# Patient Record
Sex: Female | Born: 1955 | Race: White | Hispanic: No | Marital: Married | Smoking: Never smoker
Health system: Southern US, Community
[De-identification: ages and names within clinical notes are randomized; demographics above are authoritative.]

## PROBLEM LIST (undated history)

## (undated) DIAGNOSIS — E039 Hypothyroidism, unspecified: Secondary | ICD-10-CM

## (undated) DIAGNOSIS — R112 Nausea with vomiting, unspecified: Secondary | ICD-10-CM

## (undated) DIAGNOSIS — T7840XA Allergy, unspecified, initial encounter: Secondary | ICD-10-CM

## (undated) DIAGNOSIS — K219 Gastro-esophageal reflux disease without esophagitis: Secondary | ICD-10-CM

## (undated) DIAGNOSIS — E785 Hyperlipidemia, unspecified: Secondary | ICD-10-CM

## (undated) DIAGNOSIS — R9431 Abnormal electrocardiogram [ECG] [EKG]: Secondary | ICD-10-CM

## (undated) DIAGNOSIS — Z9889 Other specified postprocedural states: Secondary | ICD-10-CM

## (undated) DIAGNOSIS — R002 Palpitations: Secondary | ICD-10-CM

## (undated) DIAGNOSIS — R011 Cardiac murmur, unspecified: Secondary | ICD-10-CM

## (undated) DIAGNOSIS — K5720 Diverticulitis of large intestine with perforation and abscess without bleeding: Secondary | ICD-10-CM

## (undated) HISTORY — PX: BREAST SURGERY: SHX581

## (undated) HISTORY — PX: COLONOSCOPY: SHX174

## (undated) HISTORY — PX: ROTATOR CUFF REPAIR: SHX139

## (undated) HISTORY — DX: Abnormal electrocardiogram (ECG) (EKG): R94.31

## (undated) HISTORY — PX: POLYPECTOMY: SHX149

## (undated) HISTORY — PX: SIGMOIDOSCOPY: SUR1295

## (undated) HISTORY — PX: TUBAL LIGATION: SHX77

## (undated) HISTORY — DX: Allergy, unspecified, initial encounter: T78.40XA

## (undated) HISTORY — DX: Palpitations: R00.2

## (undated) HISTORY — DX: Diverticulitis of large intestine with perforation and abscess without bleeding: K57.20

## (undated) HISTORY — PX: NOSE SURGERY: SHX723

## (undated) HISTORY — DX: Hyperlipidemia, unspecified: E78.5

---

## 1999-08-18 ENCOUNTER — Other Ambulatory Visit: Admission: RE | Admit: 1999-08-18 | Discharge: 1999-08-18 | Payer: Self-pay | Admitting: *Deleted

## 2000-09-04 ENCOUNTER — Emergency Department (HOSPITAL_COMMUNITY): Admission: EM | Admit: 2000-09-04 | Discharge: 2000-09-05 | Payer: Self-pay | Admitting: Emergency Medicine

## 2000-11-15 ENCOUNTER — Encounter: Admission: RE | Admit: 2000-11-15 | Discharge: 2000-11-15 | Payer: Self-pay | Admitting: *Deleted

## 2000-11-15 ENCOUNTER — Encounter: Payer: Self-pay | Admitting: *Deleted

## 2000-11-17 ENCOUNTER — Encounter: Payer: Self-pay | Admitting: *Deleted

## 2000-11-17 ENCOUNTER — Encounter: Admission: RE | Admit: 2000-11-17 | Discharge: 2000-11-17 | Payer: Self-pay | Admitting: *Deleted

## 2000-11-21 ENCOUNTER — Encounter: Payer: Self-pay | Admitting: Emergency Medicine

## 2000-11-21 ENCOUNTER — Emergency Department (HOSPITAL_COMMUNITY): Admission: EM | Admit: 2000-11-21 | Discharge: 2000-11-21 | Payer: Self-pay | Admitting: Emergency Medicine

## 2000-11-22 ENCOUNTER — Encounter: Payer: Self-pay | Admitting: Emergency Medicine

## 2000-11-22 ENCOUNTER — Ambulatory Visit (HOSPITAL_COMMUNITY): Admission: RE | Admit: 2000-11-22 | Discharge: 2000-11-22 | Payer: Self-pay | Admitting: Emergency Medicine

## 2000-12-30 ENCOUNTER — Encounter: Admission: RE | Admit: 2000-12-30 | Discharge: 2000-12-30 | Payer: Self-pay | Admitting: Sports Medicine

## 2001-04-03 ENCOUNTER — Ambulatory Visit (HOSPITAL_COMMUNITY): Admission: RE | Admit: 2001-04-03 | Discharge: 2001-04-03 | Payer: Self-pay | Admitting: Family Medicine

## 2001-04-03 ENCOUNTER — Encounter: Payer: Self-pay | Admitting: Family Medicine

## 2002-03-08 ENCOUNTER — Encounter: Payer: Self-pay | Admitting: *Deleted

## 2002-03-08 ENCOUNTER — Encounter: Admission: RE | Admit: 2002-03-08 | Discharge: 2002-03-08 | Payer: Self-pay | Admitting: *Deleted

## 2003-08-01 ENCOUNTER — Encounter: Payer: Self-pay | Admitting: Family Medicine

## 2003-08-01 ENCOUNTER — Encounter: Admission: RE | Admit: 2003-08-01 | Discharge: 2003-08-01 | Payer: Self-pay | Admitting: Family Medicine

## 2003-12-27 ENCOUNTER — Encounter: Admission: RE | Admit: 2003-12-27 | Discharge: 2003-12-27 | Payer: Self-pay | Admitting: Family Medicine

## 2005-01-27 ENCOUNTER — Ambulatory Visit (HOSPITAL_COMMUNITY): Admission: RE | Admit: 2005-01-27 | Discharge: 2005-01-27 | Payer: Self-pay | Admitting: Family Medicine

## 2005-02-01 ENCOUNTER — Encounter: Admission: RE | Admit: 2005-02-01 | Discharge: 2005-02-01 | Payer: Self-pay | Admitting: Family Medicine

## 2005-02-15 ENCOUNTER — Encounter: Admission: RE | Admit: 2005-02-15 | Discharge: 2005-02-15 | Payer: Self-pay | Admitting: Family Medicine

## 2005-08-11 ENCOUNTER — Ambulatory Visit (HOSPITAL_COMMUNITY): Admission: RE | Admit: 2005-08-11 | Discharge: 2005-08-13 | Payer: Self-pay | Admitting: Orthopedic Surgery

## 2007-10-16 ENCOUNTER — Encounter: Admission: RE | Admit: 2007-10-16 | Discharge: 2007-10-16 | Payer: Self-pay | Admitting: Family Medicine

## 2008-10-21 ENCOUNTER — Encounter: Admission: RE | Admit: 2008-10-21 | Discharge: 2008-10-21 | Payer: Self-pay | Admitting: Family Medicine

## 2009-03-10 ENCOUNTER — Ambulatory Visit (HOSPITAL_COMMUNITY): Admission: RE | Admit: 2009-03-10 | Discharge: 2009-03-10 | Payer: Self-pay | Admitting: Family Medicine

## 2009-03-20 ENCOUNTER — Ambulatory Visit (HOSPITAL_COMMUNITY): Admission: RE | Admit: 2009-03-20 | Discharge: 2009-03-20 | Payer: Self-pay | Admitting: Family Medicine

## 2010-06-25 IMAGING — CT CT ABDOMEN WO/W CM
2 of 8 series · 12 of 46 positions shown, 18 images · IV contrast (80 ml omni 300)
Comparison: 03/10/2009

CT ABDOMEN

CLINICAL DATA: Right-sided flank pain.  Lower abdominal pain.
Nausea.

CT ABDOMEN AND PELVIS WITHOUT AND WITH CONTRAST
TECHNIQUE: Multidetector CT imaging of the abdomen and pelvis was
performed following the standard protocol before and following the
bolus administration of intravenous contrast.
Contrast: 80 ml Cmnipaque-HDD

[Series 2: routine abdomen · axial · 0.70mm/px · z∈[-402,-88]mm · 9 of 79 slices shown, 15 images]
[im 8/79  soft-tissue]
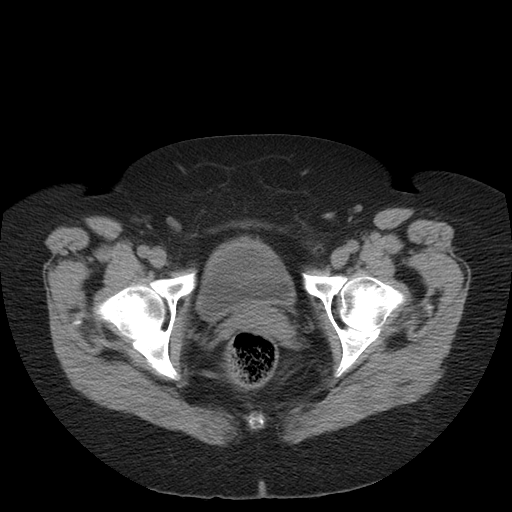
[im 8/79  bone]
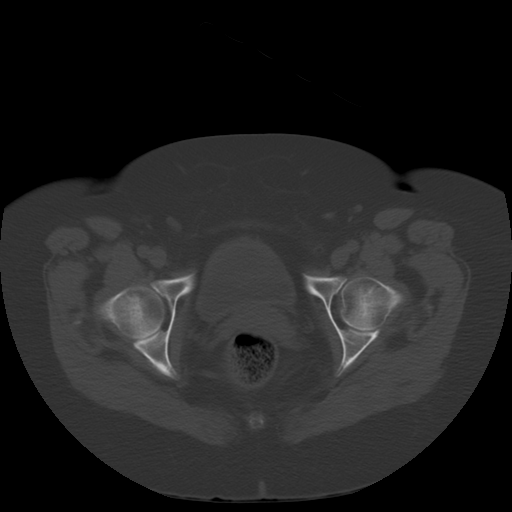
[im 15/79  soft-tissue]
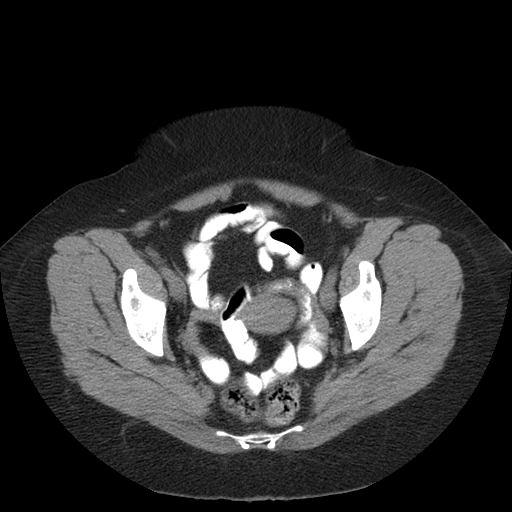
[im 22/79  soft-tissue]
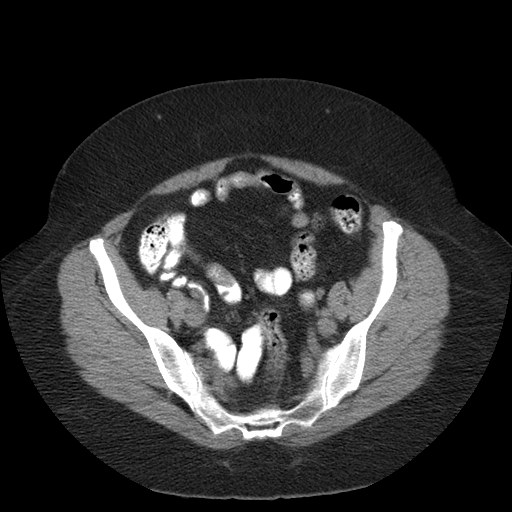
[im 29/79  soft-tissue]
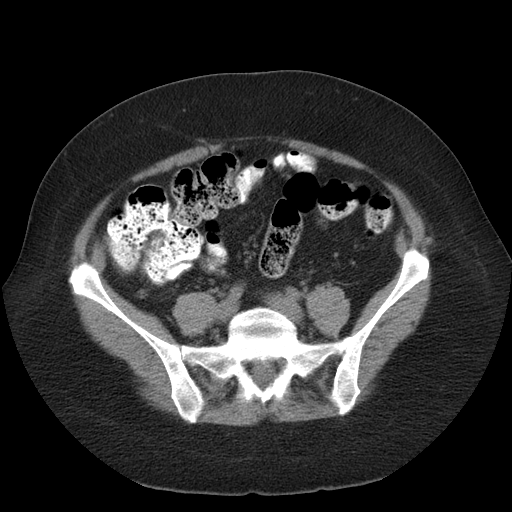
[im 43/79  soft-tissue]
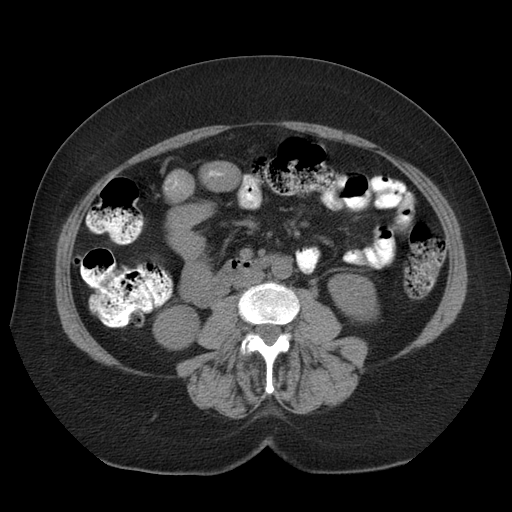
[im 50/79  soft-tissue]
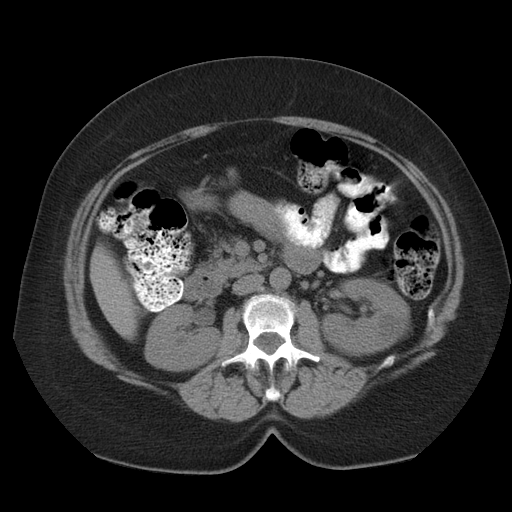
[im 50/79  lung]
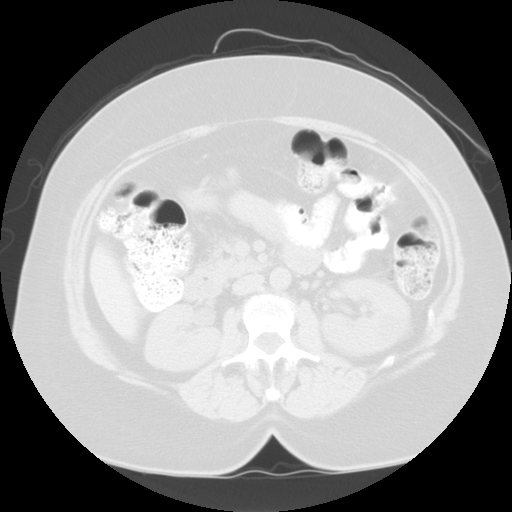
[im 57/79  soft-tissue]
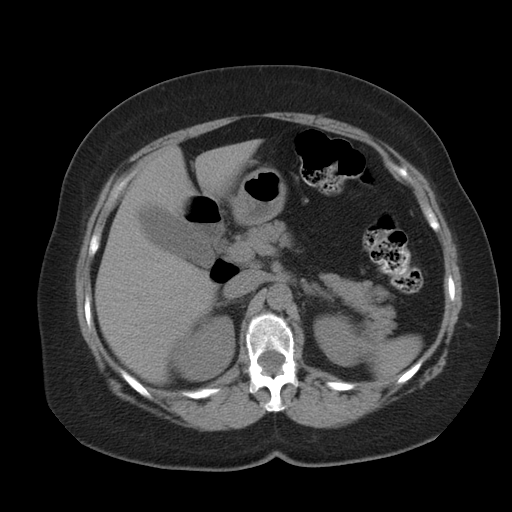
[im 57/79  lung]
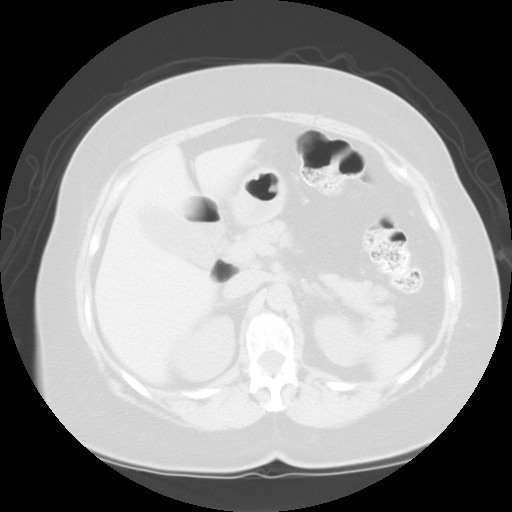
[im 64/79  soft-tissue]
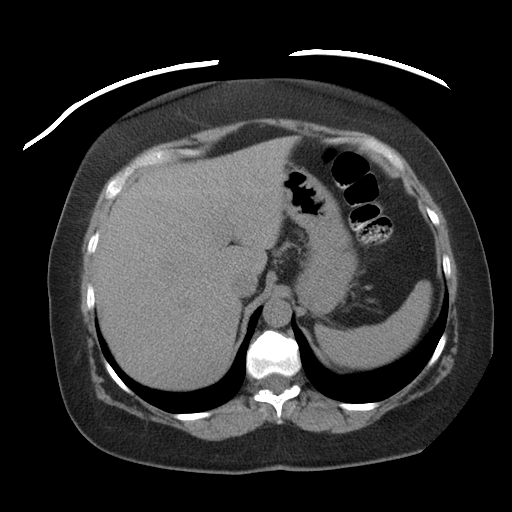
[im 64/79  lung]
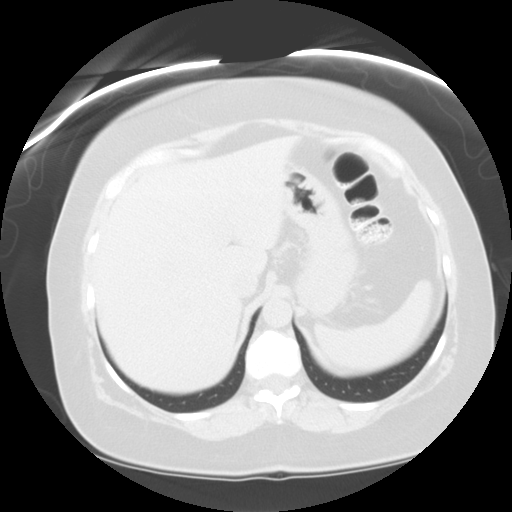
[im 71/79  soft-tissue]
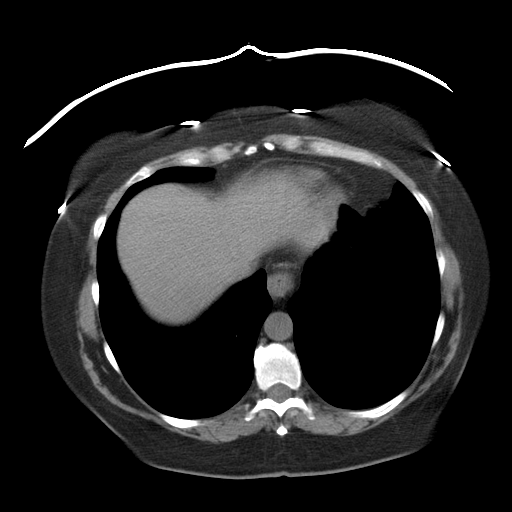
[im 71/79  lung]
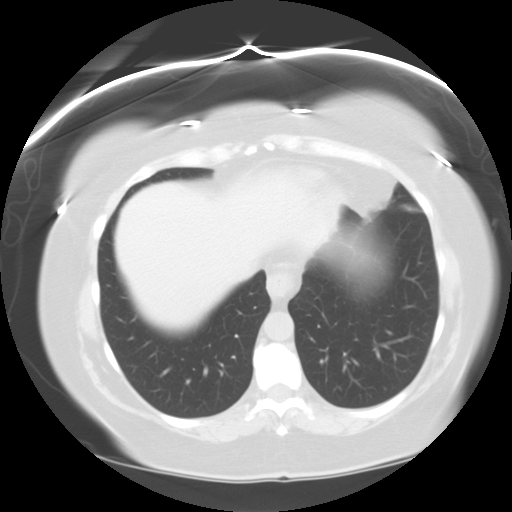
[im 71/79  bone]
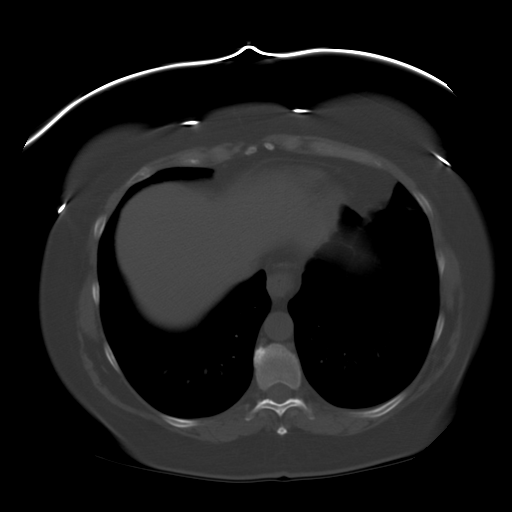

[Series 401: reformatted · coronal · 0.84mm/px · 3 of 102 slices shown]
[im 26/102  soft-tissue]
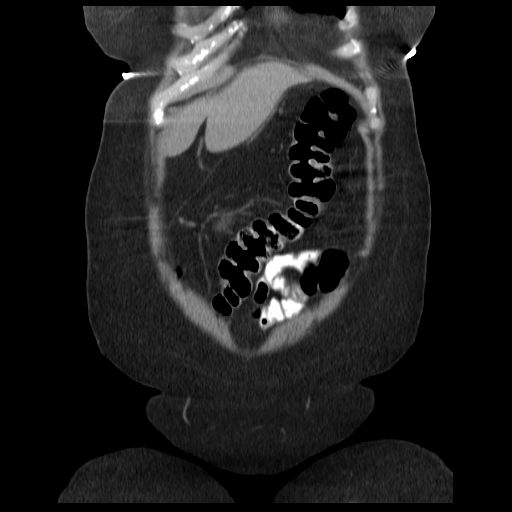
[im 51/102  soft-tissue]
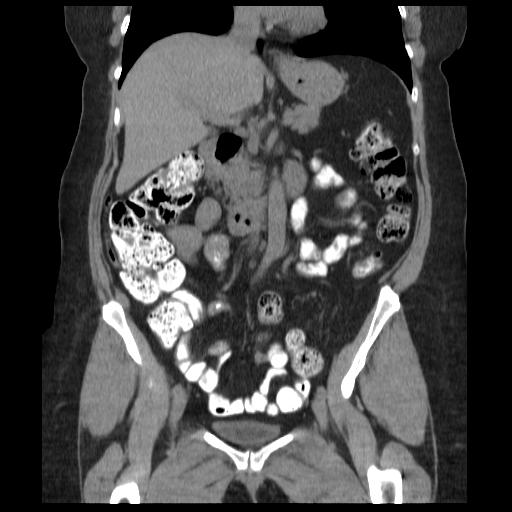
[im 76/102  soft-tissue]
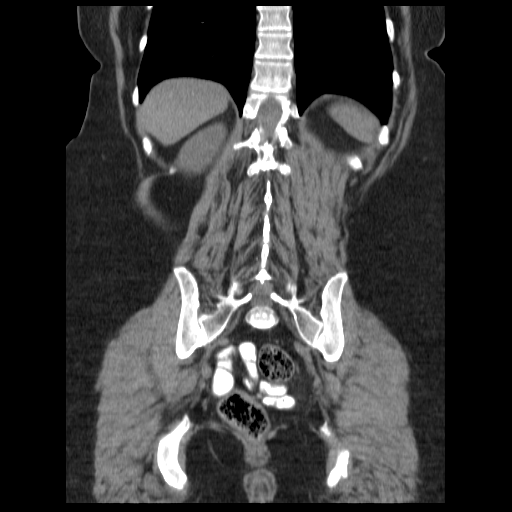

[12 of 46 positions shown; findings below may reference images not displayed]

FINDINGS: No renal calculi or hydronephrosis noted on noncontrast
imaging. Left renal cysts appears simple and nonenhancing.

A lymph node posterior to the pancreatic body measures 1.3 by
cm, borderline enlarged on image 19 of series 5.  The liver,
adrenal glands, and pancreas appear unremarkable.

On image 16 of series 5, a 7 mm faint hypodensity medially in the
spleen is noted.  This finding is nonspecific.  The spleen appears
otherwise normal.

The appendix extends cephalad lateral to the cecum, and appears
normal.

The orally administered contrast makes its way through to the
colon.  Degenerative superior endplate sclerosis is present at the
T12 level.

Although the distal common bile duct appears to taper normally,
more proximally the common bile duct measures up to 9 mm in
diameter, which is mildly dilated.  No intrahepatic bile duct
dilatation.
IMPRESSION: 1.  Mild common bile duct dilatation.  However, the distal common
bile duct is normal in caliber, and no intrahepatic biliary
dilatation is noted.
2.  Borderline enlarged a single peripancreatic lymph node.
3.  Simple-appearing left renal cysts.
4.  Nonspecific 7 mm hypodense lesion medially in the spleen.  This
lesion is statistically likely to be a benign lesion such as
hemangioma, but is technically nonspecific.

CT PELVIS
FINDINGS: Uterine and adnexal contours appear unremarkable.  No
distal ureteral calculus identified.

The urinary bladder appears normal.  Several scattered colonic
diverticula are present but do not appear inflamed.

No free pelvic fluid noted. No pathologic pelvic adenopathy is
identified.
IMPRESSION: 1.  No significant pelvic abnormality is identified explain the
patient's right-sided pain.

## 2010-10-27 ENCOUNTER — Encounter
Admission: RE | Admit: 2010-10-27 | Discharge: 2010-10-27 | Payer: Self-pay | Source: Home / Self Care | Attending: Obstetrics and Gynecology | Admitting: Obstetrics and Gynecology
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
## 2011-09-28 ENCOUNTER — Other Ambulatory Visit: Payer: Self-pay | Admitting: Obstetrics and Gynecology

## 2011-09-28 DIAGNOSIS — Z1231 Encounter for screening mammogram for malignant neoplasm of breast: Secondary | ICD-10-CM

## 2011-11-26 ENCOUNTER — Ambulatory Visit: Payer: Self-pay

## 2011-12-15 ENCOUNTER — Ambulatory Visit: Payer: Self-pay

## 2011-12-24 ENCOUNTER — Ambulatory Visit: Payer: Self-pay

## 2012-02-06 ENCOUNTER — Encounter (HOSPITAL_COMMUNITY): Payer: Self-pay | Admitting: Anesthesiology

## 2012-02-06 ENCOUNTER — Emergency Department (INDEPENDENT_AMBULATORY_CARE_PROVIDER_SITE_OTHER)
Admission: EM | Admit: 2012-02-06 | Discharge: 2012-02-06 | Disposition: A | Discharge status: Home / Self Care | Payer: BC Managed Care – PPO | Source: Home / Self Care | Attending: Emergency Medicine | Admitting: Emergency Medicine

## 2012-02-06 ENCOUNTER — Inpatient Hospital Stay (HOSPITAL_COMMUNITY)
Admission: EM | Admit: 2012-02-06 | Discharge: 2012-02-14 | DRG: 585 | Disposition: A | Discharge status: Home / Self Care | Payer: BC Managed Care – PPO | Attending: General Surgery | Admitting: General Surgery

## 2012-02-06 ENCOUNTER — Encounter (HOSPITAL_COMMUNITY): Payer: Self-pay | Admitting: Emergency Medicine

## 2012-02-06 ENCOUNTER — Emergency Department (HOSPITAL_COMMUNITY): Payer: BC Managed Care – PPO

## 2012-02-06 ENCOUNTER — Encounter (HOSPITAL_COMMUNITY): Payer: Self-pay | Admitting: *Deleted

## 2012-02-06 DIAGNOSIS — K560 Paralytic ileus: Secondary | ICD-10-CM | POA: Diagnosis not present

## 2012-02-06 DIAGNOSIS — K659 Peritonitis, unspecified: Secondary | ICD-10-CM | POA: Diagnosis present

## 2012-02-06 DIAGNOSIS — Z6837 Body mass index (BMI) 37.0-37.9, adult: Secondary | ICD-10-CM

## 2012-02-06 DIAGNOSIS — J111 Influenza due to unidentified influenza virus with other respiratory manifestations: Secondary | ICD-10-CM

## 2012-02-06 DIAGNOSIS — K5720 Diverticulitis of large intestine with perforation and abscess without bleeding: Secondary | ICD-10-CM

## 2012-02-06 DIAGNOSIS — E669 Obesity, unspecified: Secondary | ICD-10-CM | POA: Diagnosis present

## 2012-02-06 DIAGNOSIS — I959 Hypotension, unspecified: Secondary | ICD-10-CM | POA: Diagnosis present

## 2012-02-06 DIAGNOSIS — K668 Other specified disorders of peritoneum: Secondary | ICD-10-CM

## 2012-02-06 DIAGNOSIS — K5732 Diverticulitis of large intestine without perforation or abscess without bleeding: Principal | ICD-10-CM | POA: Diagnosis present

## 2012-02-06 LAB — COMPREHENSIVE METABOLIC PANEL
Albumin: 3.4 g/dL — ABNORMAL LOW (ref 3.5–5.2)
Alkaline Phosphatase: 87 U/L (ref 39–117)
BUN: 11 mg/dL (ref 6–23)
CO2: 20 mEq/L (ref 19–32)
Chloride: 101 mEq/L (ref 96–112)
GFR calc non Af Amer: 79 mL/min — ABNORMAL LOW (ref >90)
Potassium: 3.7 mEq/L (ref 3.5–5.1)
Total Bilirubin: 0.4 mg/dL (ref 0.3–1.2)

## 2012-02-06 LAB — URINALYSIS, ROUTINE W REFLEX MICROSCOPIC
Bilirubin Urine: NEGATIVE
Nitrite: NEGATIVE
Specific Gravity, Urine: 1.029 (ref 1.005–1.030)
pH: 6.5 (ref 5.0–8.0)

## 2012-02-06 LAB — TYPE AND SCREEN

## 2012-02-06 LAB — POCT URINALYSIS DIP (DEVICE)
Bilirubin Urine: NEGATIVE
Ketones, ur: NEGATIVE mg/dL
Leukocytes, UA: NEGATIVE
Specific Gravity, Urine: 1.015 (ref 1.005–1.030)

## 2012-02-06 LAB — PROTIME-INR: Prothrombin Time: 14 seconds (ref 11.6–15.2)

## 2012-02-06 LAB — CBC
HCT: 53.5 % — ABNORMAL HIGH (ref 36.0–46.0)
Hemoglobin: 17.2 g/dL — ABNORMAL HIGH (ref 12.0–15.0)
RBC: 5.79 MIL/uL — ABNORMAL HIGH (ref 3.87–5.11)
WBC: 8.1 10*3/uL (ref 4.0–10.5)

## 2012-02-06 LAB — DIFFERENTIAL
Basophils Absolute: 0 10*3/uL (ref 0.0–0.1)
Basophils Relative: 0 % (ref 0–1)
Eosinophils Relative: 0 % (ref 0–5)
Lymphocytes Relative: 21 % (ref 12–46)
Monocytes Relative: 2 % — ABNORMAL LOW (ref 3–12)
Neutro Abs: 6.2 10*3/uL (ref 1.7–7.7)

## 2012-02-06 LAB — URINE MICROSCOPIC-ADD ON

## 2012-02-06 MED ORDER — CIPROFLOXACIN IN D5W 400 MG/200ML IV SOLN
400.0000 mg | Freq: Once | INTRAVENOUS | Status: AC
  Administered 2012-02-06: 400 mg via INTRAVENOUS
  Filled 2012-02-06: qty 200

## 2012-02-06 MED ORDER — SODIUM CHLORIDE 0.9 % IV SOLN
INTRAVENOUS | Status: DC
  Administered 2012-02-06: 21:00:00 via INTRAVENOUS

## 2012-02-06 MED ORDER — IBUPROFEN 800 MG PO TABS
800.0000 mg | ORAL_TABLET | Freq: Once | ORAL | Status: AC
  Administered 2012-02-06: 800 mg via ORAL

## 2012-02-06 MED ORDER — IOHEXOL 300 MG/ML  SOLN
20.0000 mL | INTRAMUSCULAR | Status: AC
  Administered 2012-02-06 (×2): 20 mL via ORAL

## 2012-02-06 MED ORDER — ONDANSETRON 8 MG PO TBDP: ORAL_TABLET | ORAL | Status: AC

## 2012-02-06 MED ORDER — IOHEXOL 300 MG/ML  SOLN
100.0000 mL | Freq: Once | INTRAMUSCULAR | Status: AC | PRN
  Administered 2012-02-06: 100 mL via INTRAVENOUS

## 2012-02-06 MED ORDER — IBUPROFEN 800 MG PO TABS
ORAL_TABLET | ORAL | Status: AC
  Filled 2012-02-06: qty 7

## 2012-02-06 MED ORDER — PIPERACILLIN-TAZOBACTAM 3.375 G IVPB
3.3750 g | Freq: Once | INTRAVENOUS | Status: DC
  Filled 2012-02-06: qty 50

## 2012-02-06 MED ORDER — IBUPROFEN 600 MG PO TABS: 600.0000 mg | ORAL_TABLET | Freq: Four times a day (QID) | ORAL | Status: AC | PRN

## 2012-02-06 MED ORDER — METOCLOPRAMIDE HCL 5 MG/ML IJ SOLN
10.0000 mg | Freq: Once | INTRAMUSCULAR | Status: AC
  Administered 2012-02-06: 10 mg via INTRAVENOUS
  Filled 2012-02-06: qty 2

## 2012-02-06 MED ORDER — HYDROMORPHONE HCL PF 1 MG/ML IJ SOLN
1.0000 mg | Freq: Once | INTRAMUSCULAR | Status: AC
  Administered 2012-02-06: 1 mg via INTRAVENOUS
  Filled 2012-02-06: qty 1

## 2012-02-06 MED ORDER — ONDANSETRON 4 MG PO TBDP
ORAL_TABLET | ORAL | Status: AC
  Filled 2012-02-06: qty 1

## 2012-02-06 MED ORDER — ONDANSETRON HCL 4 MG/2ML IJ SOLN
4.0000 mg | Freq: Once | INTRAMUSCULAR | Status: AC
  Administered 2012-02-06: 4 mg via INTRAVENOUS
  Filled 2012-02-06: qty 2

## 2012-02-06 MED ORDER — FENTANYL CITRATE 0.05 MG/ML IJ SOLN
50.0000 ug | Freq: Once | INTRAMUSCULAR | Status: AC
  Administered 2012-02-06: 50 ug via INTRAVENOUS
  Filled 2012-02-06: qty 2

## 2012-02-06 MED ORDER — METRONIDAZOLE IN NACL 5-0.79 MG/ML-% IV SOLN
500.0000 mg | Freq: Once | INTRAVENOUS | Status: AC
  Administered 2012-02-06: 500 mg via INTRAVENOUS
  Filled 2012-02-06: qty 100

## 2012-02-06 MED ORDER — SODIUM CHLORIDE 0.9 % IV BOLUS (SEPSIS)
1000.0000 mL | Freq: Once | INTRAVENOUS | Status: AC
  Administered 2012-02-06: 1000 mL via INTRAVENOUS

## 2012-02-06 MED ORDER — ONDANSETRON 4 MG PO TBDP
4.0000 mg | ORAL_TABLET | Freq: Once | ORAL | Status: AC
  Administered 2012-02-06: 4 mg via ORAL

## 2012-02-06 NOTE — ED Notes (Signed)
Patient encouraged to start drinking ct contrast at this time.

## 2012-02-06 NOTE — ED Notes (Signed)
CT notified of patient finishing all her contrast.

## 2012-02-06 NOTE — ED Notes (Signed)
Dr Wilson at bedside.

## 2012-02-06 NOTE — ED Notes (Signed)
Let pt know we needed urine. Gave her a bedpan. Will check back in 20 minutes

## 2012-02-06 NOTE — Discharge Instructions (Signed)
Trying drink as much as electrolyte-containing fluid, such as Pedialyte, Gatorade, or another sports drink, as you can in the next 24 hours. Take small sips at a time. Drinking too much too fast will make you even more nauseous, and cause you to vomit. Return to the ED if you are unable to keep anything down despite the Zofran, if you get worse, or for any other concerns.

## 2012-02-06 NOTE — ED Notes (Signed)
Patient with abdominal pain started this am, increasing this afternoon.  Patient states she has had some nausea, no vomiting.  Patient states she has not been passing gas.  Patient states the pain is sharp and stabbing in nature.  Patient seen at [facility] today for influenza.

## 2012-02-06 NOTE — ED Notes (Signed)
Consent for surgery signed.  Patient waiting for OR readiness.

## 2012-02-06 NOTE — ED Provider Notes (Signed)
History     CSN: [id number]  Arrival date & time 02/06/12  1045   First MD Initiated Contact with Patient 02/06/12 1101      Chief Complaint  Patient presents with  . Emesis    (Consider location/radiation/quality/duration/timing/severity/associated sxs/prior treatment) HPI Comments: HPI : Flu symptoms for about 4 day. Reports feeling feverish, but no documented fevers at home. Also with chills, sweats, myalgias, fatigue, headache. Reports some oderous urine, lower abdominal pain, and some back pain, but no urinary urgency, frequency, hematuria, dysuria. Patient states that she has vomited twice, over the past few days but is tolerating very small amounts of fluids. States she was taking care of multiple family members with flulike symptoms last week. Last bowel movement was this morning, and was normal for her. Symptoms are progressively worsening, despite trying OTC fever reducing medicine and rest and fluids. Did not get her flu shot this year. Patient is currently taking Norco and clindamycin for a infected tooth. Last dose of clindamycin 2 days ago.  Review of Systems: Positive for fatigue, mild nasal congestion, mild sore throat, mild swollen anterior neck glands, mild cough. Negative for acute vision changes, stiff neck, focal weakness, syncope, seizures, respiratory distress,  diarrhea, GU symptoms.  Patient is a 56 y.o. female presenting with flu symptoms. The history is provided by the patient. No language interpreter was used.  Influenza This is a new problem. The current episode started more than 2 days ago. The problem occurs constantly. The problem has not changed since onset.Associated symptoms include headaches. Pertinent negatives include no abdominal pain. The symptoms are aggravated by nothing. The symptoms are relieved by nothing. She has tried nothing for the symptoms. The treatment provided no relief.    History reviewed. No pertinent past medical history.  Past  Surgical History  Procedure Date  . Rotator cuff repair     Family History  Problem Relation Age of Onset  . Diabetes Father   . Hypertension Father     History  Substance Use Topics  . Smoking status: Never Smoker   . Smokeless tobacco: Not on file  . Alcohol Use: No    OB History    Grav Para Term Preterm Abortions TAB SAB Ect Mult Living                  Review of Systems  Gastrointestinal: Negative for abdominal pain.  Neurological: Positive for headaches.    Allergies  Review of patient's allergies indicates no known allergies.  Home Medications   Current Outpatient Rx  Name Route Sig Dispense Refill  . CLINDAMYCIN HCL 150 MG PO CAPS Oral Take 150 mg by mouth 3 (three) times daily.    Marland Kitchen HYDROCODONE-ACETAMINOPHEN 10-325 MG PO TABS Oral Take 1 tablet by mouth every 6 (six) hours as needed.    . IBUPROFEN 600 MG PO TABS Oral Take 1 tablet (600 mg total) by mouth every 6 (six) hours as needed for pain. 30 tablet 0  . ONDANSETRON 8 MG PO TBDP  1/2- 1 tablet q 8 hr prn nausea, vomiting 20 tablet 0    BP 114/77  Pulse 89  Temp(Src) 102.3 F (39.1 C) (Oral)  Resp 20  SpO2 96%  Physical Exam  Nursing note and vitals reviewed. Constitutional: She is oriented to person, place, and time. She appears well-developed and well-nourished.       Appears ill  HENT:  Head: Normocephalic and atraumatic.  Mouth/Throat: Oropharynx is clear and moist. No oropharyngeal exudate.  TMs normal bilaterally. Mild rhinorrhea. No sinus tenderness.  Eyes: Conjunctivae and EOM are normal. Pupils are equal, round, and reactive to light.  Neck: Normal range of motion. Neck supple.  Cardiovascular: Regular rhythm, normal heart sounds and intact distal pulses.        Mild tachycardia  Pulmonary/Chest: Effort normal and breath sounds normal. No respiratory distress. She exhibits no tenderness.  Abdominal: Soft. Bowel sounds are normal. She exhibits no distension and no mass. There is  no rebound and no guarding.       Mild suprapubic tenderness, questionable right CVA tenderness  Musculoskeletal: Normal range of motion.  Lymphadenopathy:    She has no cervical adenopathy.  Neurological: She is alert and oriented to person, place, and time. No cranial nerve deficit. Coordination normal.       Gait steady  Skin: Skin is warm and dry. No rash noted.  Psychiatric: She has a normal mood and affect. Her behavior is normal. Judgment and thought content normal.    ED Course  Procedures (including critical care time)  Labs Reviewed  POCT URINALYSIS DIP (DEVICE) - Abnormal; Notable for the following:    Hgb urine dipstick TRACE (*)    Protein, ur 30 (*)    All other components within normal limits   No results found.   1. Influenza     Results for orders placed during the hospital encounter of 02/06/12  POCT URINALYSIS DIP (DEVICE)      Component Value Range   Glucose, UA NEGATIVE  NEGATIVE (mg/dL)   Bilirubin Urine NEGATIVE  NEGATIVE    Ketones, ur NEGATIVE  NEGATIVE (mg/dL)   Specific Gravity, Urine 1.015  1.005 - 1.030    Hgb urine dipstick TRACE (*) NEGATIVE    pH 6.0  5.0 - 8.0    Protein, ur 30 (*) NEGATIVE (mg/dL)   Urobilinogen, UA 0.2  0.0 - 1.0 (mg/dL)   Nitrite NEGATIVE  NEGATIVE    Leukocytes, UA NEGATIVE  NEGATIVE      MDM   Pt presents 4 days after symptom onset, and is not pregnant / postpartum, is not obese (BMI>30), does not have diabetes, sickle cell disease, HIV infection , is receiving immunosuppressive chemotherapy for cancer,  inflammatory diseases or  marrow or organ transplants, is < 2 or > 38 y/o, has evidence of severe, complicated or progressive illness. Tamiflu not indicated.   Pt Initially febrile, fever trending down prior to discharge. Gave Zofran 4 mg,  patient tolerated by mouth prior to discharge. No evidence of pharyngitis or OM. No evidence of neck stiffness or other sx to support meningitis. No evidence of dehydration. Abd  S/NT/ND without peritoneal sx. Doubt intraabdominal process. No evidence of PNA. Physical suggestive of UTI /pyelonephritis, but Udip negative for infection. Patient has significant exposure to influenza. Pt able to tolerate PO. Pt with flu. Will treat symptomatically and have pt f/u with PCP OF CHOICE PRN     Luiz Blare, MD 02/06/12 1401

## 2012-02-06 NOTE — ED Provider Notes (Signed)
History     CSN: [id number]  Arrival date & time 02/06/12  4098   First MD Initiated Contact with Patient 02/06/12 1846      Chief Complaint  Patient presents with  . Abdominal Pain  . Influenza    (Consider location/radiation/quality/duration/timing/severity/associated sxs/prior treatment) The history is provided by the patient and the spouse.  Pt is a 56 y/o F with no known PMH who presents to the ED with c/c of flu sx x 4 days as well as lower abd pain x 1 day. Flu symptoms include fever up to 103.3, chills, fatigue, HA, nasal congestion, sore throat, cough, nausea with a few episodes of non-bloody emesis, and myalgias. Last night, she had sudden onset of severe bilateral lower abd pain that did not radiate. Resolved spontaneously. She presented to urgent care this morning and was dx with Influenza- there was no abdominal pain at the time of her evaluation at [facility]. Approx 1 hour after she left urgent care, her lower abdominal pain return and she was advised to present to the ED for re-evaluation when she called [facility]. She denies any CP, SOB, vomiting, or diarrhea. Nothing makes her symptoms better or worse.   History reviewed. No pertinent past medical history.  Past Surgical History  Procedure Date  . Rotator cuff repair     Family History  Problem Relation Age of Onset  . Diabetes Father   . Hypertension Father     History  Substance Use Topics  . Smoking status: Never Smoker   . Smokeless tobacco: Not on file  . Alcohol Use: No     Review of Systems 10 systems reviewed and are negative for acute change except as noted in the HPI.  Allergies  Review of patient's allergies indicates no known allergies.  Home Medications   Current Outpatient Rx  Name Route Sig Dispense Refill  . CLINDAMYCIN HCL 150 MG PO CAPS Oral Take 150 mg by mouth 3 (three) times daily.    Marland Kitchen HYDROCODONE-ACETAMINOPHEN 10-325 MG PO TABS Oral Take 1 tablet by mouth every 6 (six) hours as needed.      . IBUPROFEN 600 MG PO TABS Oral Take 1 tablet (600 mg total) by mouth every 6 (six) hours as needed for pain. 30 tablet 0  . ONDANSETRON 8 MG PO TBDP  1/2- 1 tablet q 8 hr prn nausea, vomiting 20 tablet 0    BP 122/67  Pulse 88  Temp(Src) 100 F (37.8 C) (Oral)  Resp 20  SpO2 99%  Physical Exam  Constitutional: She is oriented to person, place, and time. She appears well-developed and well-nourished. Distressed: uncomfortable appearing.       VS reviewed and although sig hypotension initially in triage, pt is normotensive on my assessment at 127/67.  HENT:  Head: Normocephalic and atraumatic.  Right Ear: External ear normal.  Left Ear: External ear normal.       Mucous membranes slightly dry  Eyes: Pupils are equal, round, and reactive to light.  Neck: Normal range of motion. Neck supple.  Cardiovascular: Normal rate, regular rhythm, normal heart sounds and intact distal pulses.   Pulmonary/Chest: Effort normal and breath sounds normal. No respiratory distress. She has no wheezes. She exhibits no tenderness.  Abdominal: Soft. Bowel sounds are normal. She exhibits no distension. There is tenderness. There is no rebound and no guarding.       Bilateral lower quadrant tenderness, left greater than right. No rigidity.  Musculoskeletal: She exhibits no edema and  no tenderness.  Neurological: She is alert and oriented to person, place, and time. No cranial nerve deficit.  Skin: No rash noted.       Pt is pale, clammy  Psychiatric: She has a normal mood and affect.    ED Course  Procedures (including critical care time)  Labs Reviewed  CBC - Abnormal; Notable for the following:    RBC 5.79 (*)    Hemoglobin 17.2 (*)    HCT 53.5 (*)    All other components within normal limits  DIFFERENTIAL - Abnormal; Notable for the following:    Monocytes Relative 2 (*)    All other components within normal limits  COMPREHENSIVE METABOLIC PANEL - Abnormal; Notable for the following:     Glucose, Bld 111 (*)    Albumin 3.4 (*)    ALT 43 (*)    GFR calc non Af Amer 79 (*)    All other components within normal limits  URINALYSIS, ROUTINE W REFLEX MICROSCOPIC - Abnormal; Notable for the following:    Hgb urine dipstick TRACE (*)    Ketones, ur 15 (*)    Leukocytes, UA TRACE (*)    All other components within normal limits  URINE MICROSCOPIC-ADD ON - Abnormal; Notable for the following:    Squamous Epithelial / LPF FEW (*)    All other components within normal limits  PROTIME-INR  TYPE AND SCREEN  ABO/RH   Ct Abdomen Pelvis W Contrast  02/06/2012  *RADIOLOGY REPORT*  Clinical Data: Nausea, vomiting and lower abdominal pain.  CT ABDOMEN AND PELVIS WITH CONTRAST  Technique:  Multidetector CT imaging of the abdomen and pelvis was performed following the standard protocol during bolus administration of intravenous contrast.  Contrast: OMNIPAQUE IOHEXOL 300 MG/ML  SOLN  Comparison: CT of the abdomen and pelvis performed 03/20/2009  Findings: Minimal bibasilar atelectasis is noted.  There is a moderate to large amount of free air noted within the abdomen and pelvis.  This is predominately anterior in location, tracking over the hepatic dome.  This arises from the patient's relatively diffuse diverticulitis; the point of perforation is along the proximal sigmoid colon, with two small collections of air and fluid raising concern for early evolving abscess.  Diffuse soft tissue inflammation is noted along the descending and proximal sigmoid colon; this involves numerous diverticula.  The collections of free air and trace fluid are noted anterior to the left iliopsoas, measuring 2.1 cm and 1.3 cm in size.  The liver and spleen are unremarkable in appearance.  The gallbladder is within normal limits.  The pancreas and adrenal glands are unremarkable.  Scattered bilateral renal cysts are seen, measuring up to 3.9 cm in size.  The kidneys are otherwise grossly unremarkable in appearance.   There is no evidence of hydronephrosis.  No renal or ureteral stones are seen.  No perinephric stranding is appreciated.  No free fluid is identified.  The small bowel is unremarkable in appearance.  The stomach is filled with contrast; a small to moderate hiatal hernia is noted, also filled with contrast.  No acute vascular abnormalities are seen.  The appendix is normal in caliber and contains air, without evidence for appendicitis. Relatively diffuse diverticulosis is noted along much of the colon.  The bladder is mildly distended and grossly unremarkable in appearance.  The uterus is within normal limits.  The ovaries are grossly symmetric; no suspicious adnexal masses are seen.  No inguinal lymphadenopathy is seen.  No acute osseous abnormalities are identified.  IMPRESSION:  1.  Moderate to large amount of free air within the abdomen and pelvis, reflecting perforated diverticulitis. 2.  Relatively diffuse diverticulitis noted along the descending and proximal sigmoid colon.  Collections of free air and trace fluid noted at the proximal sigmoid colon, reflecting the point of perforation, measuring 2.1 cm and 1.3 cm in size, raising concern for early abscess development. 3.  Scattered bilateral renal cysts noted. 4.  Small to moderate hiatal hernia seen. 5.  Relatively diffuse diverticulosis noted involving much of the colon.  Critical Value/emergent results were called by telephone at the time of interpretation on 02/06/2012  at 10:32 p.m.  to  Dr. Glynn Octave, who verbally acknowledged these results.  Original Report Authenticated By: Tonia Ghent, M.D.     1. Pneumoperitoneum   2. Perforated diverticulum of large intestine       MDM  Influenza sx x 4 days, abdominal pain x 1 day. Labs demonstrate only likely dehydration, for which pt is given IVF in ED. CT abd/pelvis demonstrates free with perforated diverticulitis. General surgery has been consulted by attending MD and will see pt in ED for  surgery admit.        Shaaron Adler, PA-C 02/07/12 0021

## 2012-02-06 NOTE — Anesthesia Preprocedure Evaluation (Signed)
Anesthesia Evaluation  Patient identified by MRN, date of birth, ID band Patient awake    Reviewed: Allergy & Precautions, H&P , NPO status , Patient's Chart, lab work & pertinent test results, reviewed documented beta blocker date and time   Airway Mallampati: II TM Distance: >3 FB Neck ROM: full    Dental   Pulmonary neg pulmonary ROS,          Cardiovascular negative cardio ROS      Neuro/Psych negative neurological ROS  negative psych ROS   GI/Hepatic negative GI ROS, Neg liver ROS,   Endo/Other  negative endocrine ROS  Renal/GU negative Renal ROS  negative genitourinary   Musculoskeletal   Abdominal   Peds  Hematology negative hematology ROS (+)   Anesthesia Other Findings See surgeon's H&P   Reproductive/Obstetrics negative OB ROS                           Anesthesia Physical Anesthesia Plan  ASA: II and Emergent  Anesthesia Plan: General   Post-op Pain Management:    Induction:   Airway Management Planned:   Additional Equipment:   Intra-op Plan:   Post-operative Plan:   Informed Consent: I have reviewed the patients History and Physical, chart, labs and discussed the procedure including the risks, benefits and alternatives for the proposed anesthesia with the patient or authorized representative who has indicated his/her understanding and acceptance.     Plan Discussed with: CRNA and Surgeon  Anesthesia Plan Comments:         Anesthesia Quick Evaluation

## 2012-02-06 NOTE — ED Notes (Signed)
Co fever,chills, abd pain with vomiting, x 3 days, also co bodyaches

## 2012-02-06 NOTE — ED Notes (Signed)
Pt states she was seen at [facility] earlier today for influenza.  States she is now having sharp abd pain since she returned home.  Also c/o nausea, vomiting, chills, and body aches. Pt hypotensive at triage.

## 2012-02-07 ENCOUNTER — Encounter (HOSPITAL_COMMUNITY): Admission: EM | Disposition: A | Discharge status: Home / Self Care | Payer: Self-pay | Source: Home / Self Care

## 2012-02-07 ENCOUNTER — Inpatient Hospital Stay (HOSPITAL_COMMUNITY): Payer: BC Managed Care – PPO | Admitting: Anesthesiology

## 2012-02-07 ENCOUNTER — Encounter (HOSPITAL_COMMUNITY): Payer: Self-pay | Admitting: Anesthesiology

## 2012-02-07 ENCOUNTER — Encounter (HOSPITAL_COMMUNITY): Payer: Self-pay | Admitting: *Deleted

## 2012-02-07 ENCOUNTER — Encounter (HOSPITAL_COMMUNITY): Payer: Self-pay | Admitting: General Surgery

## 2012-02-07 DIAGNOSIS — K630 Abscess of intestine: Secondary | ICD-10-CM

## 2012-02-07 DIAGNOSIS — K5720 Diverticulitis of large intestine with perforation and abscess without bleeding: Secondary | ICD-10-CM

## 2012-02-07 DIAGNOSIS — S31109A Unspecified open wound of abdominal wall, unspecified quadrant without penetration into peritoneal cavity, initial encounter: Secondary | ICD-10-CM

## 2012-02-07 DIAGNOSIS — K631 Perforation of intestine (nontraumatic): Secondary | ICD-10-CM

## 2012-02-07 DIAGNOSIS — K5732 Diverticulitis of large intestine without perforation or abscess without bleeding: Secondary | ICD-10-CM

## 2012-02-07 HISTORY — PX: COLOSTOMY: SHX63

## 2012-02-07 HISTORY — PX: PARTIAL COLECTOMY: SHX5273

## 2012-02-07 HISTORY — DX: Diverticulitis of large intestine with perforation and abscess without bleeding: K57.20

## 2012-02-07 HISTORY — PX: LAPAROTOMY: SHX154

## 2012-02-07 LAB — CBC
MCH: 29.7 pg (ref 26.0–34.0)
MCHC: 32.9 g/dL (ref 30.0–36.0)
MCV: 90.3 fL (ref 78.0–100.0)
Platelets: 200 10*3/uL (ref 150–400)
RBC: 4.21 MIL/uL (ref 3.87–5.11)
RDW: 13.5 % (ref 11.5–15.5)

## 2012-02-07 LAB — GLUCOSE, CAPILLARY: Glucose-Capillary: 212 mg/dL — ABNORMAL HIGH (ref 70–99)

## 2012-02-07 LAB — MAGNESIUM: Magnesium: 1.5 mg/dL (ref 1.5–2.5)

## 2012-02-07 LAB — BASIC METABOLIC PANEL
Chloride: 107 mEq/L (ref 96–112)
Creatinine, Ser: 0.64 mg/dL (ref 0.50–1.10)
GFR calc Af Amer: >90 mL/min (ref >90)
GFR calc non Af Amer: >90 mL/min (ref >90)

## 2012-02-07 SURGERY — LAPAROTOMY, EXPLORATORY
Anesthesia: General | Site: Abdomen | Wound class: Dirty or Infected

## 2012-02-07 MED ORDER — MORPHINE SULFATE (PF) 1 MG/ML IV SOLN
INTRAVENOUS | Status: DC
  Administered 2012-02-07: 20 mg via INTRAVENOUS
  Administered 2012-02-07: 13.5 mg via INTRAVENOUS
  Administered 2012-02-07: 22:00:00 via INTRAVENOUS
  Administered 2012-02-07: 12.59 mg via INTRAVENOUS
  Administered 2012-02-07: 10:00:00 via INTRAVENOUS
  Administered 2012-02-07: 7 mg via INTRAVENOUS
  Administered 2012-02-07: 05:00:00 via INTRAVENOUS
  Administered 2012-02-07: 20.41 mg via INTRAVENOUS
  Administered 2012-02-08: 7 mg via INTRAVENOUS
  Administered 2012-02-08: 03:00:00 via INTRAVENOUS
  Administered 2012-02-08: 8 mg via INTRAVENOUS
  Administered 2012-02-08: 19.72 mg via INTRAVENOUS
  Administered 2012-02-08: 6 mg via INTRAVENOUS
  Administered 2012-02-08: 10 mg via INTRAVENOUS
  Administered 2012-02-09: 7 mg via INTRAVENOUS
  Administered 2012-02-09: 4 mg via INTRAVENOUS
  Administered 2012-02-09: 2 mg via INTRAVENOUS
  Administered 2012-02-09: 7 mg via INTRAVENOUS
  Administered 2012-02-09: 3 mg via INTRAVENOUS
  Administered 2012-02-09: 3.8 mg via INTRAVENOUS
  Administered 2012-02-09: 8 mg via INTRAVENOUS
  Administered 2012-02-09: 01:00:00 via INTRAVENOUS
  Administered 2012-02-10: 2 mg via INTRAVENOUS
  Administered 2012-02-10: 3 mg via INTRAVENOUS
  Administered 2012-02-10: 2 mg via INTRAVENOUS
  Administered 2012-02-11: 1 mg via INTRAVENOUS
  Administered 2012-02-11 (×3): 3 mg via INTRAVENOUS
  Administered 2012-02-11: 1.8 mg via INTRAVENOUS
  Administered 2012-02-12: 2 mg via INTRAVENOUS
  Administered 2012-02-12: 1 mg via INTRAVENOUS
  Administered 2012-02-12 – 2012-02-13 (×2): 2 mg via INTRAVENOUS

## 2012-02-07 MED ORDER — FENTANYL CITRATE 0.05 MG/ML IJ SOLN
INTRAMUSCULAR | Status: DC | PRN
  Administered 2012-02-07: 100 ug via INTRAVENOUS
  Administered 2012-02-07: 150 ug via INTRAVENOUS
  Administered 2012-02-07: 50 ug via INTRAVENOUS
  Administered 2012-02-07: 100 ug via INTRAVENOUS
  Administered 2012-02-07 (×3): 50 ug via INTRAVENOUS

## 2012-02-07 MED ORDER — MORPHINE SULFATE (PF) 1 MG/ML IV SOLN
INTRAVENOUS | Status: AC
  Filled 2012-02-07: qty 25

## 2012-02-07 MED ORDER — CIPROFLOXACIN IN D5W 400 MG/200ML IV SOLN
400.0000 mg | Freq: Two times a day (BID) | INTRAVENOUS | Status: DC
  Administered 2012-02-07 – 2012-02-13 (×14): 400 mg via INTRAVENOUS
  Filled 2012-02-07 (×18): qty 200

## 2012-02-07 MED ORDER — DIPHENHYDRAMINE HCL 50 MG/ML IJ SOLN: 12.5000 mg | Freq: Four times a day (QID) | INTRAMUSCULAR | Status: DC | PRN

## 2012-02-07 MED ORDER — DROPERIDOL 2.5 MG/ML IJ SOLN
INTRAMUSCULAR | Status: DC | PRN
  Administered 2012-02-07: 0.625 mg via INTRAVENOUS

## 2012-02-07 MED ORDER — NALOXONE HCL 0.4 MG/ML IJ SOLN: 0.4000 mg | INTRAMUSCULAR | Status: DC | PRN

## 2012-02-07 MED ORDER — VECURONIUM BROMIDE 10 MG IV SOLR
INTRAVENOUS | Status: DC | PRN
  Administered 2012-02-07: 2 mg via INTRAVENOUS
  Administered 2012-02-07: 6 mg via INTRAVENOUS
  Administered 2012-02-07 (×2): 2 mg via INTRAVENOUS

## 2012-02-07 MED ORDER — LIDOCAINE HCL (CARDIAC) 20 MG/ML IV SOLN
INTRAVENOUS | Status: DC | PRN
  Administered 2012-02-07: 100 mg via INTRAVENOUS

## 2012-02-07 MED ORDER — SODIUM CHLORIDE 0.9 % IJ SOLN: 9.0000 mL | INTRAMUSCULAR | Status: DC | PRN

## 2012-02-07 MED ORDER — METRONIDAZOLE IN NACL 5-0.79 MG/ML-% IV SOLN
500.0000 mg | Freq: Three times a day (TID) | INTRAVENOUS | Status: DC
  Administered 2012-02-07 – 2012-02-14 (×22): 500 mg via INTRAVENOUS
  Filled 2012-02-07 (×25): qty 100

## 2012-02-07 MED ORDER — ONDANSETRON HCL 4 MG PO TABS: 4.0000 mg | ORAL_TABLET | Freq: Four times a day (QID) | ORAL | Status: DC | PRN

## 2012-02-07 MED ORDER — SODIUM CHLORIDE 0.9 % IV SOLN
INTRAVENOUS | Status: DC | PRN
  Administered 2012-02-07 (×2): via INTRAVENOUS

## 2012-02-07 MED ORDER — PANTOPRAZOLE SODIUM 40 MG IV SOLR
40.0000 mg | Freq: Every day | INTRAVENOUS | Status: DC
  Administered 2012-02-07 – 2012-02-12 (×6): 40 mg via INTRAVENOUS
  Filled 2012-02-07 (×7): qty 40

## 2012-02-07 MED ORDER — HETASTARCH-ELECTROLYTES 6 % IV SOLN
500.0000 mL | Freq: Once | INTRAVENOUS | Status: AC
  Administered 2012-02-07: 500 mL via INTRAVENOUS
  Filled 2012-02-07: qty 500

## 2012-02-07 MED ORDER — HYDROMORPHONE HCL PF 1 MG/ML IJ SOLN
INTRAMUSCULAR | Status: AC
  Filled 2012-02-07: qty 1

## 2012-02-07 MED ORDER — INSULIN ASPART 100 UNIT/ML ~~LOC~~ SOLN
0.0000 [IU] | SUBCUTANEOUS | Status: DC
  Administered 2012-02-07: 3 [IU] via SUBCUTANEOUS
  Administered 2012-02-08 – 2012-02-11 (×11): 2 [IU] via SUBCUTANEOUS
  Administered 2012-02-12: 3 [IU] via SUBCUTANEOUS
  Administered 2012-02-12 – 2012-02-13 (×3): 2 [IU] via SUBCUTANEOUS
  Administered 2012-02-13: 3 [IU] via SUBCUTANEOUS
  Administered 2012-02-13: 2 [IU] via SUBCUTANEOUS
  Filled 2012-02-07: qty 0.15

## 2012-02-07 MED ORDER — MORPHINE SULFATE (PF) 1 MG/ML IV SOLN
INTRAVENOUS | Status: AC
  Administered 2012-02-07: 15:00:00 via INTRAVENOUS
  Filled 2012-02-07: qty 25

## 2012-02-07 MED ORDER — ONDANSETRON HCL 4 MG/2ML IJ SOLN
4.0000 mg | Freq: Four times a day (QID) | INTRAMUSCULAR | Status: DC | PRN
  Administered 2012-02-08 – 2012-02-13 (×11): 4 mg via INTRAVENOUS
  Filled 2012-02-07 (×11): qty 2

## 2012-02-07 MED ORDER — SUCCINYLCHOLINE CHLORIDE 20 MG/ML IJ SOLN
INTRAMUSCULAR | Status: DC | PRN
  Administered 2012-02-07: 140 mg via INTRAVENOUS

## 2012-02-07 MED ORDER — DIPHENHYDRAMINE HCL 12.5 MG/5ML PO ELIX
12.5000 mg | ORAL_SOLUTION | Freq: Four times a day (QID) | ORAL | Status: DC | PRN
  Filled 2012-02-07: qty 5

## 2012-02-07 MED ORDER — PHENOL 1.4 % MT LIQD: 1.0000 | OROMUCOSAL | Status: DC | PRN

## 2012-02-07 MED ORDER — ENOXAPARIN SODIUM 40 MG/0.4ML ~~LOC~~ SOLN
40.0000 mg | SUBCUTANEOUS | Status: DC
  Administered 2012-02-07 – 2012-02-13 (×7): 40 mg via SUBCUTANEOUS
  Filled 2012-02-07 (×8): qty 0.4

## 2012-02-07 MED ORDER — GLYCOPYRROLATE 0.2 MG/ML IJ SOLN
INTRAMUSCULAR | Status: DC | PRN
  Administered 2012-02-07: .6 mg via INTRAVENOUS

## 2012-02-07 MED ORDER — NEOSTIGMINE METHYLSULFATE 1 MG/ML IJ SOLN
INTRAMUSCULAR | Status: DC | PRN
  Administered 2012-02-07: 5 mg via INTRAVENOUS

## 2012-02-07 MED ORDER — PROPOFOL 10 MG/ML IV EMUL
INTRAVENOUS | Status: DC | PRN
  Administered 2012-02-07: 200 mg via INTRAVENOUS

## 2012-02-07 MED ORDER — ONDANSETRON HCL 4 MG/2ML IJ SOLN
INTRAMUSCULAR | Status: DC | PRN
  Administered 2012-02-07: 4 mg via INTRAVENOUS

## 2012-02-07 MED ORDER — HYDROMORPHONE HCL PF 1 MG/ML IJ SOLN
0.2500 mg | INTRAMUSCULAR | Status: DC | PRN
  Administered 2012-02-07 (×4): 0.5 mg via INTRAVENOUS

## 2012-02-07 MED ORDER — KCL IN DEXTROSE-NACL 20-5-0.45 MEQ/L-%-% IV SOLN
INTRAVENOUS | Status: DC
  Administered 2012-02-07: 14:00:00 via INTRAVENOUS
  Administered 2012-02-07: 125 mL/h via INTRAVENOUS
  Administered 2012-02-08 – 2012-02-13 (×8): via INTRAVENOUS
  Filled 2012-02-07 (×21): qty 1000

## 2012-02-07 MED ORDER — MIDAZOLAM HCL 5 MG/5ML IJ SOLN
INTRAMUSCULAR | Status: DC | PRN
  Administered 2012-02-07: 2 mg via INTRAVENOUS

## 2012-02-07 MED ORDER — METOCLOPRAMIDE HCL 5 MG/ML IJ SOLN
INTRAMUSCULAR | Status: DC | PRN
  Administered 2012-02-07: 10 mg via INTRAVENOUS

## 2012-02-07 MED ORDER — LACTATED RINGERS IV SOLN
INTRAVENOUS | Status: DC | PRN
  Administered 2012-02-07: 03:00:00 via INTRAVENOUS

## 2012-02-07 MED ORDER — METOCLOPRAMIDE HCL 5 MG/ML IJ SOLN: 10.0000 mg | Freq: Once | INTRAMUSCULAR | Status: DC | PRN

## 2012-02-07 SURGICAL SUPPLY — 46 items
BLADE SURG ROTATE 9660 (MISCELLANEOUS) ×4 IMPLANT
CANISTER SUCTION 2500CC (MISCELLANEOUS) IMPLANT
CANISTER WOUND CARE 500ML ATS (WOUND CARE) ×4 IMPLANT
CHLORAPREP W/TINT 26ML (MISCELLANEOUS) ×4 IMPLANT
CLOTH BEACON ORANGE TIMEOUT ST (SAFETY) ×4 IMPLANT
COVER SURGICAL LIGHT HANDLE (MISCELLANEOUS) ×4 IMPLANT
DRAPE LAPAROSCOPIC ABDOMINAL (DRAPES) ×4 IMPLANT
DRAPE UTILITY 15X26 W/TAPE STR (DRAPE) ×8 IMPLANT
DRAPE WARM FLUID 44X44 (DRAPE) ×4 IMPLANT
DRSG VAC ATS LRG SENSATRAC (GAUZE/BANDAGES/DRESSINGS) ×8 IMPLANT
ELECT BLADE 6.5 EXT (BLADE) ×4 IMPLANT
ELECT CAUTERY BLADE 6.4 (BLADE) ×4 IMPLANT
ELECT REM PT RETURN 9FT ADLT (ELECTROSURGICAL) ×4
ELECTRODE REM PT RTRN 9FT ADLT (ELECTROSURGICAL) ×3 IMPLANT
GLOVE BIOGEL M STRL SZ7.5 (GLOVE) ×8 IMPLANT
GLOVE BIOGEL PI IND STRL 8 (GLOVE) ×6 IMPLANT
GLOVE BIOGEL PI INDICATOR 8 (GLOVE) ×2
GOWN STRL NON-REIN LRG LVL3 (GOWN DISPOSABLE) ×8 IMPLANT
GOWN STRL REIN XL XLG (GOWN DISPOSABLE) ×8 IMPLANT
KIT BASIN OR (CUSTOM PROCEDURE TRAY) ×4 IMPLANT
KIT OSTOMY DRAINABLE 2.75 STR (WOUND CARE) ×4 IMPLANT
KIT ROOM TURNOVER OR (KITS) ×4 IMPLANT
LIGASURE IMPACT 36 18CM CVD LR (INSTRUMENTS) ×4 IMPLANT
NS IRRIG 1000ML POUR BTL (IV SOLUTION) ×32 IMPLANT
PACK GENERAL/GYN (CUSTOM PROCEDURE TRAY) ×4 IMPLANT
PAD ARMBOARD 7.5X6 YLW CONV (MISCELLANEOUS) ×8 IMPLANT
RELOAD PROXIMATE 75MM BLUE (ENDOMECHANICALS) ×4 IMPLANT
SEPRAFILM PROCEDURAL PACK 3X5 (MISCELLANEOUS) ×4 IMPLANT
SPECIMEN JAR LARGE (MISCELLANEOUS) ×4 IMPLANT
SPONGE GAUZE 4X4 12PLY (GAUZE/BANDAGES/DRESSINGS) IMPLANT
SPONGE LAP 18X18 X RAY DECT (DISPOSABLE) ×12 IMPLANT
STAPLER PROXIMATE 75MM BLUE (STAPLE) ×4 IMPLANT
STAPLER VISISTAT 35W (STAPLE) ×4 IMPLANT
SUCTION POOLE TIP (SUCTIONS) ×4 IMPLANT
SUT PDS AB 1 TP1 96 (SUTURE) ×8 IMPLANT
SUT PROLENE 2 0 CT2 30 (SUTURE) ×4 IMPLANT
SUT SILK 2 0 SH CR/8 (SUTURE) ×8 IMPLANT
SUT SILK 2 0 TIES 10X30 (SUTURE) ×4 IMPLANT
SUT SILK 3 0 SH CR/8 (SUTURE) ×4 IMPLANT
SUT SILK 3 0 TIES 10X30 (SUTURE) ×4 IMPLANT
SUT VIC AB 3-0 SH 18 (SUTURE) ×12 IMPLANT
TOWEL OR 17X24 6PK STRL BLUE (TOWEL DISPOSABLE) ×4 IMPLANT
TOWEL OR 17X26 10 PK STRL BLUE (TOWEL DISPOSABLE) ×4 IMPLANT
TRAY FOLEY CATH 14FRSI W/METER (CATHETERS) ×4 IMPLANT
WATER STERILE IRR 1000ML POUR (IV SOLUTION) IMPLANT
YANKAUER SUCT BULB TIP NO VENT (SUCTIONS) ×8 IMPLANT

## 2012-02-07 NOTE — Transfer of Care (Signed)
Immediate Anesthesia Transfer of Care Note  Patient: [name]  Procedure(s) Performed: Procedure(s) (LRB): EXPLORATORY LAPAROTOMY (Bilateral) PARTIAL COLECTOMY (N/A) COLOSTOMY (Left)  Patient Location: PACU  Anesthesia Type: General  Level of Consciousness: sedated, patient cooperative and responds to stimulation  Airway & Oxygen Therapy: Patient Spontanous Breathing and Patient connected to nasal cannula oxygen  Post-op Assessment: Report given to PACU RN, Post -op Vital signs reviewed and stable, Patient moving all extremities and Patient moving all extremities X 4  Post vital signs: Reviewed and stable  Complications: No apparent anesthesia complications

## 2012-02-07 NOTE — Op Note (Signed)
[name], OHLINGER              ACCOUNT NO.:  [id number]  MEDICAL RECORD NO.:  [id number]  LOCATION:  [facility]                         FACILITY:  [facility]  PHYSICIAN:  Mary Sella. Andrey Campanile, MD, FACSDATE OF BIRTH:  1955/11/06  DATE OF PROCEDURE:  02/07/2012 DATE OF DISCHARGE:                              OPERATIVE REPORT   PREOPERATIVE DIAGNOSIS:  Perforated sigmoid diverticulitis with generalized peritonitis.  POSTOPERATIVE DIAGNOSIS:  Perforated sigmoid diverticulitis with generalized peritonitis.  PROCEDURES: 1. Exploratory laparotomy. 2. Sigmoid colectomy with end ostomy (Hartmann's procedure). 3. Application of wound VAC.  SURGEON:  Mary Sella. Andrey Campanile, MD, FACS.  ASSISTANT SURGEON:  Dr. Carman Ching, FACS.  ANESTHESIA:  General.  ESTIMATED BLOOD LOSS:  Less than 100.  ANESTHESIA:  General.  SPECIMEN:  Sigmoid colon.  FINDINGS:  The patient had a focal perforation in her mid sigmoid colon. She had a redundant sigmoid colon.  There was gross pus and some enteric contents in the abdomen.  INDICATIONS FOR PROCEDURE:  The patient is a very pleasant 56 year old female who is otherwise healthy, who began feeling ill earlier in the week.  She initially thought she had some flu because she had cared for some family members who had the flu as well.  However, she developed abdominal pain over the weekend and the abdominal pain became quite severe.  She came to the hospital where CT scan was performed which demonstrated significant pneumoperitoneum as well as colitis.  On exam, she had generalized peritonitis, and therefore I recommended exploratory laparotomy with colectomy and end ostomy.  We did discuss other alternatives.  However, the patient elected to proceed to surgery.  We discussed the risks and benefits of surgery including, but not limited to, bleeding, infection, injury to surrounding structures, incisional hernia, parastomal hernia, ostomy complications, blood clot  formation, anesthesia risk, abscess formation, postoperative ileus, perioperative cardiac and pulmonary complications, wound infection, and the need for additional procedures.  She elected to proceed with surgery.  DESCRIPTION PROCEDURE:  The patient was taken urgently to the operating room and placed supine on the operating room table after informed consent was obtained.  General endotracheal anesthesia was established. Sequential compression devices were placed.  The Foley catheter was placed.  Her abdomen was prepped and draped in usual standard surgical fashion.  She received Cipro and Flagyl prior to skin incision. Surgical time-out was performed.  A midline incision was made starting from the pubic bone to about 3 inches above the umbilicus.  The subcutaneous tissue was divided with electrocautery and the fascia was divided.  A small serosal tear was made in the loop of small bowel and this was repaired with 2 interrupted 3-0 silk Lembert sutures.  Upon reflecting the small bowel in the right upper quadrant, we found gross purulent fluid and some minor enteric contents in the left pericolic gutter and in the pelvis.  She had a redundant sigmoid colon.  We found the focal area of perforation in the upper sigmoid colon.  We inspected the remaining part of the bowel.  There was no other evidence of perforation or any other abnormalities.  She had a very redundant transverse colon as well.  There is no evidence of  any small bowel pathology or other colonic pathology.  It appeared just to be a focal segment of colitis, although, she did have diverticular disease through the most part of her colon.  Therefore, we did elected to do a small segmental resection of the area of perforation.  A small rent was made in the mesentery just next to the colonic wall several inches downstream from the perforation.  I then divided the bowel distally with a GIA stapler 75 mm with a blue load.  I then  created a small rent in the mesentery just next to the colon lumen several inches proximal to the perforation and again divided in similar fashion with a GIA stapler 75 mm blue load.  We then took down the mesentery staying close to the colonic wall in a serial fashion using the LigaSure device.  We then took down the white line of Toldt with Bovie electrocautery, as well as with  blunt dissection.  We also used the LigaSure device as well.  The specimen had been passed off the field at this point.  We continued to mobilize the left colon in order to help bring it up through the abdominal wall.  After doing this, we removed the  retractor.  About 2 inches above the umbilicus and about 3 inches lateral to the midline, a circular skin defect was made with electrocautery, some of the subcutaneous tissue was excised.  The fascia was divided in a cruciate fashion and the muscle was spread with a Tresa Endo.  The posterior peritoneum was divided in a cruciate fashion, I was able to pass 2 fingers easily.  A Tanja Port was advanced through the ostomy defect and the end staple line was brought out through the abdominal wall.  It came out quite easily.  It appeared pink and viable. I then placed two 2-0 Prolene sutures through the stapled off distal colon.  It should be noted that the staple off remaining colon is in the abdominal cavity and not in the pelvis.  There is a very long segment remaining colon.  We then irrigated the abdomen copiously with 6 liters of saline.  We then closed the fascia with a running looped #1 PDS, 1 from above, 1 from below.  We reinspected the stapled colon that had been brought up through the ostomy skin defect in the left upper quadrant.  It was a little bit congested, we ended up incising the fascia slightly, and this allowed the colon to pink up and appear quite viable.  Prior to closing the fascia, it should be noted that we had placed Seprafilm in the abdominal cavity,  along the midline and along the right side and some along the left side.  I then transected 1 cm of the colon proximal to the staple line to mature the colostomy.  The entire lumen was quite viable and pink.  I then matured the ostomy with interrupted 3-0 Vicryl sutures, they were in the 4 quadrants, it was brooked.  In the remaining area, simple interrupted suture was placed.  Appliance bag was applied. I then changed gloves and irrigated the midline incision copiously.  I then applied a wound VAC in the standard fashion.  The patient was extubated and taken to recovery room in stable condition.  All needle, instrument, and sponge counts were correct x2.  There are no immediate complications.  The patient tolerated the procedure well.     Mary Sella. Andrey Campanile, MD, FACS     EMW/MEDQ  D:  02/07/2012  T:  02/07/2012  Job:  [id number]

## 2012-02-07 NOTE — Progress Notes (Signed)
UR completed.  Anticipating HHRN needed at d/c for pt with new colostomy. Pt with [employer] coverage so no Face to Face document will be needed.

## 2012-02-07 NOTE — Anesthesia Postprocedure Evaluation (Signed)
Anesthesia Post Note  Patient: [name]  Procedure(s) Performed: Procedure(s) (LRB): EXPLORATORY LAPAROTOMY (Bilateral) PARTIAL COLECTOMY (N/A) COLOSTOMY (Left)  Anesthesia type: general  Patient location: PACU  Post pain: Pain level controlled  Post assessment: Patient's Cardiovascular Status Stable  Last Vitals:  Filed Vitals:   02/07/12 0415  BP: 111/52  Pulse:   Temp:   Resp:     Post vital signs: Reviewed and stable  Level of consciousness: sedated  Complications: No apparent anesthesia complications

## 2012-02-07 NOTE — Consult Note (Signed)
WOC ostomy consult  Stoma type/location:  Pt with new colostomy from surgery last night.   Stomal assessment/size:  Stoma red and viable, appears to be flush with skin level.  Pouch not removed at this time. Output Scant blood-tinged drainage in pouch, no stool or flatus at this time. Ostomy pouching: 1pc Education provided: Educational materials left in room.  No family present at this time.  Briefly discussed ostomy with pt and she states her husband will be assisting with pouching activities.  Plan to demonstrate pouch change when stable and out of ICU.  Supplies ordered to bedside for staff use.    Abd vac applied in surgery last night to midline abd.  Intact with good seal at 125cm.  Plan dressing change for Wed.   Cammie Mcgee, RN, MSN, [employer]  [phone]

## 2012-02-07 NOTE — ED Provider Notes (Signed)
Medical screening examination/treatment/procedure(s) were conducted as a shared visit with non-physician practitioner(s) and myself.  I personally evaluated the patient during the encounter  Flu symptoms for 1 week with diffuse abdominal pain beginning today.  Se[facility] at [facility].  Abdominal exam initially not impressive but developed peritonitis during course of ED stay.  CT with perforated diverticulitis and large pneumoperitoneum.  Dr. Andrey Campanile to take to OR.  CRITICAL CARE Performed by: Glynn Octave   Total critical care time: 30  Critical care time was exclusive of separately billable procedures and treating other patients.  Critical care was necessary to treat or prevent imminent or life-threatening deterioration.  Critical care was time spent personally by me on the following activities: development of treatment plan with patient and/or surrogate as well as nursing, discussions with consultants, evaluation of patient's response to treatment, examination of patient, obtaining history from patient or surrogate, ordering and performing treatments and interventions, ordering and review of laboratory studies, ordering and review of radiographic studies, pulse oximetry and re-evaluation of patient's condition.   Glynn Octave, MD 02/07/12 [phone]

## 2012-02-07 NOTE — Preoperative (Signed)
Beta Blockers   Reason not to administer Beta Blockers:Not Applicable 

## 2012-02-07 NOTE — Brief Op Note (Signed)
02/07/2012  3:04 AM  PATIENT:  [name]  56 y.o. female  PRE-OPERATIVE DIAGNOSIS:  Perforated Sigmoid Diverticulitis with generalized peritonitis  POST-OPERATIVE DIAGNOSIS:  Same   PROCEDURE:  Procedure(s) (LRB): EXPLORATORY LAPAROTOMY (Bilateral) PARTIAL COLECTOMY (N/A) COLOSTOMY (Left) APPLICATION OF WOUND VAC  SURGEON:  Surgeon(s) and Role:    * Atilano Ina, MD,FACS - Primary    * Shelly Rubenstein, MD - Assisting  PHYSICIAN ASSISTANT: N/A  ASSISTANTS: SEE ABOVE   ANESTHESIA:   general  EBL:  Total I/O In: 3000 [I.V.:3000] Out: 1000 [Urine:800; Blood:200]  BLOOD ADMINISTERED:none  DRAINS: Urinary Catheter (Foley) and wound vac   LOCAL MEDICATIONS USED:  NONE  SPECIMEN:  Source of Specimen:  sigmoid colon   DISPOSITION OF SPECIMEN:  PATHOLOGY  COUNTS:  YES  TOURNIQUET:  * No tourniquets in log *  DICTATION: .Other Dictation: Dictation Number [id number]  PLAN OF CARE: Admit to inpatient   PATIENT DISPOSITION:  ICU - extubated and stable.   Delay start of Pharmacological VTE agent (>24hrs) due to surgical blood loss or risk of bleeding: no  Mary Sella. Andrey Campanile, MD, FACS General, Bariatric, & Minimally Invasive Surgery [facility], [place]

## 2012-02-07 NOTE — H&P (Signed)
[name] is an 56 y.o. female.   Chief Complaint: abdominal pain HPI: 56 year old obese Caucasian female comes to the ER complaining of severe abdominal pain. She states that she hasn't felt well for about a week. She went to [place] last Sunday to care for family members that had the flu. On Tuesday and Wednesday, she developed upper respiratory symptoms, muscle aches, low-grade temperatures as well as emesis. Her appetite also diminished. She came back home on Friday. she thought she had developed the flu. She states that at 5 AM on Sunday she developed stomach pain. She describes it as severe. She took a hydrocodone which did not give her any relief. She came to the urgent care Center where she was found to have a temperature of 103. She has had body aches. She was treated for the flu and discharged. She came back because of worsening abdominal pain now in her bilateral lower quadrants. She underwent a CT scan which demonstrated colitis with pneumoperitoneum. She denies any weight loss. She denies any melena, hematochezia, diarrhea, she had a colonoscopy within the past years which was normal. She had a bowel movement today which was normal. She has never had any symptoms like this before. She initially was hypotensive on arrival but responded to fluids.  History reviewed. No pertinent past medical history.  Past Surgical History  Procedure Date  . Rotator cuff repair   . Tubal ligation   . Nose surgery     Family History  Problem Relation Age of Onset  . Diabetes Father   . Hypertension Father    Social History:  reports that she has never smoked. She does not have any smokeless tobacco history on file. She reports that she does not drink alcohol or use illicit drugs.  Allergies: No Known Allergies  Medications Prior to Admission  Medication Dose Route Frequency Provider Last Rate Last Dose  . 0.9 %  sodium chloride infusion   Intravenous Continuous Glynn Octave, MD 125 mL/hr  at 02/06/12 2100    . ciprofloxacin (CIPRO) IVPB 400 mg  400 mg Intravenous Once Glynn Octave, MD   400 mg at 02/06/12 2308  . fentaNYL (SUBLIMAZE) injection 50 mcg  50 mcg Intravenous Once Shaaron Adler, PA-C   50 mcg at 02/06/12 1956  . HYDROmorphone (DILAUDID) injection 1 mg  1 mg Intravenous Once Shaaron Adler, PA-C   1 mg at 02/06/12 2102  . HYDROmorphone (DILAUDID) injection 1 mg  1 mg Intravenous Once Shaaron Adler, PA-C   1 mg at 02/06/12 2252  . ibuprofen (ADVIL,MOTRIN) tablet 800 mg  800 mg Oral Once Luiz Blare, MD   800 mg at 02/06/12 1307  . iohexol (OMNIPAQUE) 300 MG/ML solution 100 mL  100 mL Intravenous Once PRN Medication Radiologist, MD   100 mL at 02/06/12 2203  . iohexol (OMNIPAQUE) 300 MG/ML solution 20 mL  20 mL Oral Q1 Hr x 2 Medication Radiologist, MD   20 mL at 02/06/12 2104  . metoCLOPramide (REGLAN) injection 10 mg  10 mg Intravenous Once Shaaron Adler, PA-C   10 mg at 02/06/12 2100  . metroNIDAZOLE (FLAGYL) IVPB 500 mg  500 mg Intravenous Once Glynn Octave, MD   500 mg at 02/06/12 2309  . ondansetron (ZOFRAN) injection 4 mg  4 mg Intravenous Once Shaaron Adler, PA-C   4 mg at 02/06/12 1955  . ondansetron (ZOFRAN-ODT) disintegrating tablet 4 mg  4 mg Oral Once Luiz Blare, MD  4 mg at 02/06/12 1255  . sodium chloride 0.9 % bolus 1,000 mL  1,000 mL Intravenous Once Glynn Octave, MD   1,000 mL at 02/06/12 1933  . DISCONTD: piperacillin-tazobactam (ZOSYN) IVPB 3.375 g  3.375 g Intravenous Once Glynn Octave, MD       No current outpatient prescriptions on file as of 02/07/2012.    Results for orders placed during the hospital encounter of 02/06/12 (from the past 48 hour(s))  CBC     Status: Abnormal   Collection Time   02/06/12  7:05 PM      Component Value Range Comment   WBC 8.1  4.0 - 10.5 (K/uL)    RBC 5.79 (*) 3.87 - 5.11 (MIL/uL)    Hemoglobin 17.2 (*) 12.0 - 15.0 (g/dL)    HCT 16.1 (*)  09.6 - 46.0 (%)    MCV 92.4  78.0 - 100.0 (fL)    MCH 29.7  26.0 - 34.0 (pg)    MCHC 32.1  30.0 - 36.0 (g/dL)    RDW 04.5  40.9 - 81.1 (%)    Platelets 194  150 - 400 (K/uL)   DIFFERENTIAL     Status: Abnormal   Collection Time   02/06/12  7:05 PM      Component Value Range Comment   Neutrophils Relative 77  43 - 77 (%)    Lymphocytes Relative 21  12 - 46 (%)    Monocytes Relative 2 (*) 3 - 12 (%)    Eosinophils Relative 0  0 - 5 (%)    Basophils Relative 0  0 - 1 (%)    Neutro Abs 6.2  1.7 - 7.7 (K/uL)    Lymphs Abs 1.7  0.7 - 4.0 (K/uL)    Monocytes Absolute 0.2  0.1 - 1.0 (K/uL)    Eosinophils Absolute 0.0  0.0 - 0.7 (K/uL)    Basophils Absolute 0.0  0.0 - 0.1 (K/uL)    WBC Morphology INCREASED BANDS (>20% BANDS)     COMPREHENSIVE METABOLIC PANEL     Status: Abnormal   Collection Time   02/06/12  7:05 PM      Component Value Range Comment   Sodium 139  135 - 145 (mEq/L)    Potassium 3.7  3.5 - 5.1 (mEq/L)    Chloride 101  96 - 112 (mEq/L)    CO2 20  19 - 32 (mEq/L)    Glucose, Bld 111 (*) 70 - 99 (mg/dL)    BUN 11  6 - 23 (mg/dL)    Creatinine, Ser 9.14  0.50 - 1.10 (mg/dL)    Calcium 9.4  8.4 - 10.5 (mg/dL)    Total Protein 6.9  6.0 - 8.3 (g/dL)    Albumin 3.4 (*) 3.5 - 5.2 (g/dL)    AST 27  0 - 37 (U/L)    ALT 43 (*) 0 - 35 (U/L)    Alkaline Phosphatase 87  39 - 117 (U/L)    Total Bilirubin 0.4  0.3 - 1.2 (mg/dL)    GFR calc non Af Amer 79 (*) >90 (mL/min)    GFR calc Af Amer >90  >90 (mL/min)   PROTIME-INR     Status: Normal   Collection Time   02/06/12  7:05 PM      Component Value Range Comment   Prothrombin Time 14.0  11.6 - 15.2 (seconds)    INR 1.06  0.00 - 1.49    TYPE AND SCREEN     Status: Normal  Collection Time   02/06/12  7:25 PM      Component Value Range Comment   ABO/RH(D) A POS      Antibody Screen NEG      Sample Expiration 02/09/2012     ABO/RH     Status: Normal   Collection Time   02/06/12  7:25 PM      Component Value Range Comment    ABO/RH(D) A POS     URINALYSIS, ROUTINE W REFLEX MICROSCOPIC     Status: Abnormal   Collection Time   02/06/12 10:10 PM      Component Value Range Comment   Color, Urine YELLOW  YELLOW     APPearance CLEAR  CLEAR     Specific Gravity, Urine 1.029  1.005 - 1.030     pH 6.5  5.0 - 8.0     Glucose, UA NEGATIVE  NEGATIVE (mg/dL)    Hgb urine dipstick TRACE (*) NEGATIVE     Bilirubin Urine NEGATIVE  NEGATIVE     Ketones, ur 15 (*) NEGATIVE (mg/dL)    Protein, ur NEGATIVE  NEGATIVE (mg/dL)    Urobilinogen, UA 0.2  0.0 - 1.0 (mg/dL)    Nitrite NEGATIVE  NEGATIVE     Leukocytes, UA TRACE (*) NEGATIVE    URINE MICROSCOPIC-ADD ON     Status: Abnormal   Collection Time   02/06/12 10:10 PM      Component Value Range Comment   Squamous Epithelial / LPF FEW (*) RARE     WBC, UA 0-2  <3 (WBC/hpf)    RBC / HPF 0-2  <3 (RBC/hpf)    Bacteria, UA RARE  RARE     Ct Abdomen Pelvis W Contrast  02/06/2012  *RADIOLOGY REPORT*  Clinical Data: Nausea, vomiting and lower abdominal pain.  CT ABDOMEN AND PELVIS WITH CONTRAST  Technique:  Multidetector CT imaging of the abdomen and pelvis was performed following the standard protocol during bolus administration of intravenous contrast.  Contrast: OMNIPAQUE IOHEXOL 300 MG/ML  SOLN  Comparison: CT of the abdomen and pelvis performed 03/20/2009  Findings: Minimal bibasilar atelectasis is noted.  There is a moderate to large amount of free air noted within the abdomen and pelvis.  This is predominately anterior in location, tracking over the hepatic dome.  This arises from the patient's relatively diffuse diverticulitis; the point of perforation is along the proximal sigmoid colon, with two small collections of air and fluid raising concern for early evolving abscess.  Diffuse soft tissue inflammation is noted along the descending and proximal sigmoid colon; this involves numerous diverticula.  The collections of free air and trace fluid are noted anterior to the left  iliopsoas, measuring 2.1 cm and 1.3 cm in size.  The liver and spleen are unremarkable in appearance.  The gallbladder is within normal limits.  The pancreas and adrenal glands are unremarkable.  Scattered bilateral renal cysts are seen, measuring up to 3.9 cm in size.  The kidneys are otherwise grossly unremarkable in appearance.  There is no evidence of hydronephrosis.  No renal or ureteral stones are seen.  No perinephric stranding is appreciated.  No free fluid is identified.  The small bowel is unremarkable in appearance.  The stomach is filled with contrast; a small to moderate hiatal hernia is noted, also filled with contrast.  No acute vascular abnormalities are seen.  The appendix is normal in caliber and contains air, without evidence for appendicitis. Relatively diffuse diverticulosis is noted along much of the  colon.  The bladder is mildly distended and grossly unremarkable in appearance.  The uterus is within normal limits.  The ovaries are grossly symmetric; no suspicious adnexal masses are seen.  No inguinal lymphadenopathy is seen.  No acute osseous abnormalities are identified.  IMPRESSION:  1.  Moderate to large amount of free air within the abdomen and pelvis, reflecting perforated diverticulitis. 2.  Relatively diffuse diverticulitis noted along the descending and proximal sigmoid colon.  Collections of free air and trace fluid noted at the proximal sigmoid colon, reflecting the point of perforation, measuring 2.1 cm and 1.3 cm in size, raising concern for early abscess development. 3.  Scattered bilateral renal cysts noted. 4.  Small to moderate hiatal hernia seen. 5.  Relatively diffuse diverticulosis noted involving much of the colon.  Critical Value/emergent results were called by telephone at the time of interpretation on 02/06/2012  at 10:32 p.m.  to  Dr. Glynn Octave, who verbally acknowledged these results.  Original Report Authenticated By: Tonia Ghent, M.D.    Review of Systems    Constitutional: Positive for fever, chills and malaise/fatigue. Negative for weight loss and diaphoresis.       Upper respiratory symptoms   HENT: Negative for hearing loss.   Eyes: Negative for double vision and redness.  Respiratory: Positive for cough and sputum production. Negative for shortness of breath.   Cardiovascular: Negative for chest pain, orthopnea, leg swelling and PND.  Gastrointestinal: Negative for constipation, blood in stool and melena.  Genitourinary: Negative for dysuria, urgency and frequency.  Musculoskeletal: Positive for myalgias. Negative for joint pain and falls.  Skin: Negative for itching and rash.  Neurological: Negative for dizziness, tingling, focal weakness, seizures, loss of consciousness, weakness and headaches.       No TIA; no amaurosis fugax   Psychiatric/Behavioral: Negative for depression.    Blood pressure 110/51, pulse 102, temperature 100 F (37.8 C), temperature source Oral, resp. rate 18, SpO2 99.00%. Physical Exam  Vitals reviewed. Constitutional: She is oriented to person, place, and time. She appears well-developed and well-nourished. She appears distressed.  HENT:  Head: Normocephalic and atraumatic.  Right Ear: External ear normal.  Left Ear: External ear normal.  Nose: Nose normal.  Mouth/Throat: No oropharyngeal exudate.  Eyes: Conjunctivae are normal. No scleral icterus.  Neck: Normal range of motion. Neck supple. No tracheal deviation present. No thyromegaly present.  Cardiovascular: Normal pulses.  Tachycardia present.   Respiratory: Effort normal and breath sounds normal. No respiratory distress. She has no wheezes.  GI: Soft. Bowel sounds are normal. There is tenderness. There is rebound and guarding.       Generalized peritonitis. Diffuse TTP. Can't stand for me to barely palpate abdomen  Musculoskeletal: Normal range of motion. She exhibits no edema and no tenderness.  Lymphadenopathy:    She has no cervical adenopathy.   Neurological: She is alert and oriented to person, place, and time. She exhibits normal muscle tone.  Skin: Skin is warm and dry. She is not diaphoretic.  Psychiatric: She has a normal mood and affect. Her behavior is normal. Judgment and thought content normal.     Assessment/Plan Perforated Left/sigmoid diverticulitis with generalized peritonitis.   Rec admission with IVF, NPO, IV abx and urgent trip to OR for Exp lap, bowel resection, ostomy. Because of her generalized peritonitis, i donot believe treating her like a diverticulitis with a microperforation will be successful. We have discussed the pros and cons of non-operative vs operative management. The patient desires surgical  intervention.   I discussed the procedure in detail. We discussed the risks and benefits of surgery including, but not limited to bleeding, infection (such as wound infection, abdominal abscess), injury to surrounding structures, blood clot formation, ostomy complications, urinary retention, incisional hernia, anastomotic stricture, anastomotic leak, anesthesia risks, pulmonary & cardiac complications such as pneumonia &/or heart attack, need for additional procedures, ileus, & prolonged hospitalization.  We discussed the typical postoperative recovery course, including limitations & restrictions postoperatively. I explained that the likelihood of improvement in their symptoms is good.  Mary Sella. Andrey Campanile, MD, FACS General, Bariatric, & Minimally Invasive Surgery [facility], [place]    [facility] M 02/07/2012, 12:49 AM

## 2012-02-07 NOTE — Progress Notes (Signed)
Day of Surgery  Subjective: PCA controlling pain  Objective: Vital signs in last 24 hours: Temp:  [97.5 F (36.4 C)-103.3 F (39.6 C)] 99 F (37.2 C) (04/08 0430) Pulse Rate:  [66-107] 79  (04/08 0800) Resp:  [14-28] 20  (04/08 0800) BP: (68-127)/(42-80) 102/59 mmHg (04/08 0800) SpO2:  [86 %-100 %] 98 % (04/08 0800) Weight:  [81.2 kg (179 lb 0.2 oz)] 81.2 kg (179 lb 0.2 oz) (04/08 0430) Last BM Date: 02/06/12  Intake/Output from previous day: 04/07 0701 - 04/08 0700 In: 4185 [I.V.:4075; IV Piggyback:110] Out: 1270 [Urine:1070; Blood:200] Intake/Output this shift: Total I/O In: 138.5 [I.V.:138.5] Out: 150 [Urine:150]  General appearance: alert, cooperative and no distress Resp: clear to auscultation bilaterally Cardio: regular rate and rhythm GI: Soft, VAC in place on midline wound, quiet, ostomy pink with minimal fluid output, good seal on bag at this time  Lab Results:   [facility] 02/06/12 1905  WBC 8.1  HGB 17.2*  HCT 53.5*  PLT 194   BMET  Basename 02/06/12 1905  NA 139  K 3.7  CL 101  CO2 20  GLUCOSE 111*  BUN 11  CREATININE 0.81  CALCIUM 9.4   PT/INR  Basename 02/06/12 1905  LABPROT 14.0  INR 1.06   ABG No results found for this basename: PHART:2,PCO2:2,PO2:2,HCO3:2 in the last 72 hours  Studies/Results: Ct Abdomen Pelvis W Contrast  02/06/2012  *RADIOLOGY REPORT*  Clinical Data: Nausea, vomiting and lower abdominal pain.  CT ABDOMEN AND PELVIS WITH CONTRAST  Technique:  Multidetector CT imaging of the abdomen and pelvis was performed following the standard protocol during bolus administration of intravenous contrast.  Contrast: OMNIPAQUE IOHEXOL 300 MG/ML  SOLN  Comparison: CT of the abdomen and pelvis performed 03/20/2009  Findings: Minimal bibasilar atelectasis is noted.  There is a moderate to large amount of free air noted within the abdomen and pelvis.  This is predominately anterior in location, tracking over the hepatic dome.  This arises  from the patient's relatively diffuse diverticulitis; the point of perforation is along the proximal sigmoid colon, with two small collections of air and fluid raising concern for early evolving abscess.  Diffuse soft tissue inflammation is noted along the descending and proximal sigmoid colon; this involves numerous diverticula.  The collections of free air and trace fluid are noted anterior to the left iliopsoas, measuring 2.1 cm and 1.3 cm in size.  The liver and spleen are unremarkable in appearance.  The gallbladder is within normal limits.  The pancreas and adrenal glands are unremarkable.  Scattered bilateral renal cysts are seen, measuring up to 3.9 cm in size.  The kidneys are otherwise grossly unremarkable in appearance.  There is no evidence of hydronephrosis.  No renal or ureteral stones are seen.  No perinephric stranding is appreciated.  No free fluid is identified.  The small bowel is unremarkable in appearance.  The stomach is filled with contrast; a small to moderate hiatal hernia is noted, also filled with contrast.  No acute vascular abnormalities are seen.  The appendix is normal in caliber and contains air, without evidence for appendicitis. Relatively diffuse diverticulosis is noted along much of the colon.  The bladder is mildly distended and grossly unremarkable in appearance.  The uterus is within normal limits.  The ovaries are grossly symmetric; no suspicious adnexal masses are seen.  No inguinal lymphadenopathy is seen.  No acute osseous abnormalities are identified.  IMPRESSION:  1.  Moderate to large amount of free air within  the abdomen and pelvis, reflecting perforated diverticulitis. 2.  Relatively diffuse diverticulitis noted along the descending and proximal sigmoid colon.  Collections of free air and trace fluid noted at the proximal sigmoid colon, reflecting the point of perforation, measuring 2.1 cm and 1.3 cm in size, raising concern for early abscess development. 3.  Scattered  bilateral renal cysts noted. 4.  Small to moderate hiatal hernia seen. 5.  Relatively diffuse diverticulosis noted involving much of the colon.  Critical Value/emergent results were called by telephone at the time of interpretation on 02/06/2012  at 10:32 p.m.  to  Dr. Glynn Octave, who verbally acknowledged these results.  Original Report Authenticated By: Tonia Ghent, M.D.    Anti-infectives: Anti-infectives     Start     Dose/Rate Route Frequency Ordered Stop   02/07/12 1000   ciprofloxacin (CIPRO) IVPB 400 mg        400 mg 200 mL/hr over 60 Minutes Intravenous Every 12 hours 02/07/12 0454     02/07/12 0600   metroNIDAZOLE (FLAGYL) IVPB 500 mg        500 mg 100 mL/hr over 60 Minutes Intravenous Every 8 hours 02/07/12 0454     02/06/12 2300   ciprofloxacin (CIPRO) IVPB 400 mg        400 mg 200 mL/hr over 60 Minutes Intravenous  Once 02/06/12 2245 02/07/12 0008   02/06/12 2300   metroNIDAZOLE (FLAGYL) IVPB 500 mg        500 mg 100 mL/hr over 60 Minutes Intravenous  Once 02/06/12 2245 02/07/12 0009   02/06/12 2245   piperacillin-tazobactam (ZOSYN) IVPB 3.375 g  Status:  Discontinued        3.375 g 12.5 mL/hr over 240 Minutes Intravenous  Once 02/06/12 2232 02/06/12 2245          Assessment/Plan: s/p Procedure(s) (LRB): EXPLORATORY LAPAROTOMY (Bilateral) PARTIAL COLECTOMY (N/A) COLOSTOMY (Left) S/P sigmoid colectomy with Luz Brazen POD #1 Ileus - ice chips Hypotension - hespan bolus and watch in 3100 SDU status today VTE - Lovenox, PAS ID - Cipro/Flagyl  LOS: 1 day    Kniyah Khun E 02/07/2012

## 2012-02-07 NOTE — Anesthesia Procedure Notes (Signed)
Procedure Name: Intubation Date/Time: 02/07/2012 1:09 AM Performed by: Wray Kearns A Pre-anesthesia Checklist: Patient identified, Timeout performed, Emergency Drugs available, Suction available and Patient being monitored Patient Re-evaluated:Patient Re-evaluated prior to inductionOxygen Delivery Method: Circle system utilized Preoxygenation: Pre-oxygenation with 100% oxygen Intubation Type: IV induction and Cricoid Pressure applied Ventilation: Mask ventilation without difficulty Laryngoscope Size: Mac and 3 Grade View: Grade I Tube type: Oral Tube size: 8.0 mm Number of attempts: 1 Airway Equipment and Method: Stylet Placement Confirmation: ETT inserted through vocal cords under direct vision,  positive ETCO2,  CO2 detector and breath sounds checked- equal and bilateral Secured at: 22 cm Tube secured with: Tape Dental Injury: Teeth and Oropharynx as per pre-operative assessment

## 2012-02-08 ENCOUNTER — Encounter (HOSPITAL_COMMUNITY): Payer: Self-pay | Admitting: General Surgery

## 2012-02-08 LAB — GLUCOSE, CAPILLARY
Glucose-Capillary: 121 mg/dL — ABNORMAL HIGH (ref 70–99)
Glucose-Capillary: 131 mg/dL — ABNORMAL HIGH (ref 70–99)
Glucose-Capillary: 125 mg/dL — ABNORMAL HIGH (ref 70–99)
Glucose-Capillary: 105 mg/dL — ABNORMAL HIGH (ref 70–99)

## 2012-02-08 LAB — BASIC METABOLIC PANEL
BUN: 6 mg/dL (ref 6–23)
Calcium: 7.4 mg/dL — ABNORMAL LOW (ref 8.4–10.5)
GFR calc non Af Amer: >90 mL/min (ref >90)
Glucose, Bld: 142 mg/dL — ABNORMAL HIGH (ref 70–99)

## 2012-02-08 LAB — CBC
HCT: 35.2 % — ABNORMAL LOW (ref 36.0–46.0)
Hemoglobin: 11.5 g/dL — ABNORMAL LOW (ref 12.0–15.0)
MCH: 29.4 pg (ref 26.0–34.0)
MCHC: 32.7 g/dL (ref 30.0–36.0)

## 2012-02-08 MED ORDER — ALUM & MAG HYDROXIDE-SIMETH 200-200-20 MG/5ML PO SUSP
30.0000 mL | Freq: Four times a day (QID) | ORAL | Status: DC | PRN
  Administered 2012-02-08: 30 mL via ORAL
  Filled 2012-02-08: qty 30

## 2012-02-08 MED ORDER — MORPHINE SULFATE (PF) 1 MG/ML IV SOLN
INTRAVENOUS | Status: AC
  Filled 2012-02-08: qty 25

## 2012-02-08 MED ORDER — ACETAMINOPHEN 160 MG/5ML PO SOLN
650.0000 mg | Freq: Four times a day (QID) | ORAL | Status: DC | PRN
  Administered 2012-02-08 (×2): 650 mg via ORAL
  Filled 2012-02-08 (×2): qty 20.3

## 2012-02-08 MED ORDER — PROMETHAZINE HCL 25 MG/ML IJ SOLN
12.5000 mg | INTRAMUSCULAR | Status: DC | PRN
  Administered 2012-02-08 – 2012-02-10 (×6): 12.5 mg via INTRAVENOUS
  Filled 2012-02-08 (×6): qty 1

## 2012-02-08 NOTE — Progress Notes (Signed)
1 Day Post-Op  Subjective: Doing better, no nausea, BP better  Objective: Vital signs in last 24 hours: Temp:  [99.4 F (37.4 C)-100.4 F (38 C)] 100.4 F (38 C) (04/09 0400) Pulse Rate:  [73-101] 100  (04/09 0600) Resp:  [12-27] 24  (04/09 0600) BP: (80-123)/(43-66) 123/58 mmHg (04/09 0600) SpO2:  [96 %-100 %] 96 % (04/09 0600) Weight:  [89.3 kg (196 lb 13.9 oz)] 89.3 kg (196 lb 13.9 oz) (04/09 0400) Last BM Date: 02/06/12  Intake/Output from previous day: 04/08 0701 - 04/09 0700 In: 0454.0 [I.V.:3493.2; IV Piggyback:1200] Out: 1480 [Urine:1480] Intake/Output this shift:    General appearance: alert and cooperative Resp: clear to auscultation bilaterally Cardio: regular rate and rhythm GI: soft, VAC in place, quiet, ostomy pink with serous fluid only  Lab Results:   [facility] 02/08/12 0343 02/07/12 0911  WBC 5.9 4.7  HGB 11.5* 12.5  HCT 35.2* 38.0  PLT 182 200   BMET  Basename 02/08/12 0343 02/07/12 0911  NA 134* 137  K 3.9 3.9  CL 104 107  CO2 22 21  GLUCOSE 142* 213*  BUN 6 7  CREATININE 0.57 0.64  CALCIUM 7.4* 7.2*   PT/INR  Basename 02/06/12 1905  LABPROT 14.0  INR 1.06   ABG No results found for this basename: PHART:2,PCO2:2,PO2:2,HCO3:2 in the last 72 hours  Studies/Results: Ct Abdomen Pelvis W Contrast  02/06/2012  *RADIOLOGY REPORT*  Clinical Data: Nausea, vomiting and lower abdominal pain.  CT ABDOMEN AND PELVIS WITH CONTRAST  Technique:  Multidetector CT imaging of the abdomen and pelvis was performed following the standard protocol during bolus administration of intravenous contrast.  Contrast: OMNIPAQUE IOHEXOL 300 MG/ML  SOLN  Comparison: CT of the abdomen and pelvis performed 03/20/2009  Findings: Minimal bibasilar atelectasis is noted.  There is a moderate to large amount of free air noted within the abdomen and pelvis.  This is predominately anterior in location, tracking over the hepatic dome.  This arises from the patient's relatively  diffuse diverticulitis; the point of perforation is along the proximal sigmoid colon, with two small collections of air and fluid raising concern for early evolving abscess.  Diffuse soft tissue inflammation is noted along the descending and proximal sigmoid colon; this involves numerous diverticula.  The collections of free air and trace fluid are noted anterior to the left iliopsoas, measuring 2.1 cm and 1.3 cm in size.  The liver and spleen are unremarkable in appearance.  The gallbladder is within normal limits.  The pancreas and adrenal glands are unremarkable.  Scattered bilateral renal cysts are seen, measuring up to 3.9 cm in size.  The kidneys are otherwise grossly unremarkable in appearance.  There is no evidence of hydronephrosis.  No renal or ureteral stones are seen.  No perinephric stranding is appreciated.  No free fluid is identified.  The small bowel is unremarkable in appearance.  The stomach is filled with contrast; a small to moderate hiatal hernia is noted, also filled with contrast.  No acute vascular abnormalities are seen.  The appendix is normal in caliber and contains air, without evidence for appendicitis. Relatively diffuse diverticulosis is noted along much of the colon.  The bladder is mildly distended and grossly unremarkable in appearance.  The uterus is within normal limits.  The ovaries are grossly symmetric; no suspicious adnexal masses are seen.  No inguinal lymphadenopathy is seen.  No acute osseous abnormalities are identified.  IMPRESSION:  1.  Moderate to large amount of free air within  the abdomen and pelvis, reflecting perforated diverticulitis. 2.  Relatively diffuse diverticulitis noted along the descending and proximal sigmoid colon.  Collections of free air and trace fluid noted at the proximal sigmoid colon, reflecting the point of perforation, measuring 2.1 cm and 1.3 cm in size, raising concern for early abscess development. 3.  Scattered bilateral renal cysts noted.  4.  Small to moderate hiatal hernia seen. 5.  Relatively diffuse diverticulosis noted involving much of the colon.  Critical Value/emergent results were called by telephone at the time of interpretation on 02/06/2012  at 10:32 p.m.  to  Dr. Glynn Octave, who verbally acknowledged these results.  Original Report Authenticated By: Tonia Ghent, M.D.    Anti-infectives: Anti-infectives     Start     Dose/Rate Route Frequency Ordered Stop   02/07/12 1000   ciprofloxacin (CIPRO) IVPB 400 mg        400 mg 200 mL/hr over 60 Minutes Intravenous Every 12 hours 02/07/12 0454     02/07/12 0600   metroNIDAZOLE (FLAGYL) IVPB 500 mg        500 mg 100 mL/hr over 60 Minutes Intravenous Every 8 hours 02/07/12 0454     02/06/12 2300   ciprofloxacin (CIPRO) IVPB 400 mg        400 mg 200 mL/hr over 60 Minutes Intravenous  Once 02/06/12 2245 02/07/12 0008   02/06/12 2300   metroNIDAZOLE (FLAGYL) IVPB 500 mg        500 mg 100 mL/hr over 60 Minutes Intravenous  Once 02/06/12 2245 02/07/12 0009   02/06/12 2245   piperacillin-tazobactam (ZOSYN) IVPB 3.375 g  Status:  Discontinued        3.375 g 12.5 mL/hr over 240 Minutes Intravenous  Once 02/06/12 2232 02/06/12 2245          Assessment/Plan: s/p Procedure(s) (LRB): EXPLORATORY LAPAROTOMY (Bilateral) PARTIAL COLECTOMY (N/A) COLOSTOMY (Left) S/P sigmoid colectomy with Luz Brazen POD #2 Ileus - may have water and ice Hypotension - resolved and good U/O now VTE - Lovenox, PAS ID - Cipro/Flagyl Hyperglycemia - SSI, better D/C foley Mobilize Transfer to floor  LOS: 2 days    Sister Carbone E 02/08/2012

## 2012-02-09 LAB — GLUCOSE, CAPILLARY
Glucose-Capillary: 100 mg/dL — ABNORMAL HIGH (ref 70–99)
Glucose-Capillary: 120 mg/dL — ABNORMAL HIGH (ref 70–99)
Glucose-Capillary: 84 mg/dL (ref 70–99)
Glucose-Capillary: 85 mg/dL (ref 70–99)

## 2012-02-09 LAB — CBC
HCT: 32.1 % — ABNORMAL LOW (ref 36.0–46.0)
MCH: 29.9 pg (ref 26.0–34.0)
MCHC: 33 g/dL (ref 30.0–36.0)
RDW: 13.4 % (ref 11.5–15.5)

## 2012-02-09 LAB — BASIC METABOLIC PANEL
BUN: 4 mg/dL — ABNORMAL LOW (ref 6–23)
Calcium: 7.8 mg/dL — ABNORMAL LOW (ref 8.4–10.5)
Creatinine, Ser: 0.53 mg/dL (ref 0.50–1.10)
GFR calc Af Amer: >90 mL/min (ref >90)
GFR calc non Af Amer: >90 mL/min (ref >90)
Potassium: 3.4 mEq/L — ABNORMAL LOW (ref 3.5–5.1)

## 2012-02-09 MED ORDER — MORPHINE SULFATE (PF) 1 MG/ML IV SOLN
INTRAVENOUS | Status: AC
  Filled 2012-02-09: qty 25

## 2012-02-09 MED ORDER — POTASSIUM CHLORIDE 10 MEQ/100ML IV SOLN
INTRAVENOUS | Status: AC
  Administered 2012-02-09: 10 meq
  Filled 2012-02-09: qty 100

## 2012-02-09 MED ORDER — POTASSIUM CHLORIDE 10 MEQ/100ML IV SOLN
10.0000 meq | INTRAVENOUS | Status: AC
  Administered 2012-02-09 (×3): 10 meq via INTRAVENOUS
  Filled 2012-02-09 (×3): qty 100

## 2012-02-09 MED ORDER — MORPHINE SULFATE (PF) 1 MG/ML IV SOLN
INTRAVENOUS | Status: AC
  Administered 2012-02-09: 19:00:00
  Filled 2012-02-09: qty 25

## 2012-02-09 NOTE — Progress Notes (Signed)
Patient ID: [name], female   DOB: 09/22/1956, 56 y.o.   MRN: [id number] 2 Days Post-Op  Subjective: Pt feels quite nauseous today.  Says she had some emesis, but RN doesn't report that.  No flatus in bag.  Objective: Vital signs in last 24 hours: Temp:  [98.4 F (36.9 C)-102.8 F (39.3 C)] 98.9 F (37.2 C) (04/10 0429) Pulse Rate:  [83-99] 91  (04/10 0429) Resp:  [16-24] 16  (04/10 0800) BP: (107-125)/(56-62) 115/62 mmHg (04/10 0429) SpO2:  [97 %-100 %] 98 % (04/10 0800) Last BM Date: 02/06/12  Intake/Output from previous day: 04/09 0701 - 04/10 0700 In: 1423.5 [P.O.:120; I.V.:1303.5] Out: 1640 [Urine:1560; Drains:50; Stool:30] Intake/Output this shift:    PE: Abd: soft, distended, few BS, tender, VAC in place, stoma is pink and viable.  No flatus or output in ostomy.  Lab Results:   Basename 02/09/12 0650 02/08/12 0343  WBC 5.1 5.9  HGB 10.6* 11.5*  HCT 32.1* 35.2*  PLT 203 182   BMET  Basename 02/09/12 0650 02/08/12 0343  NA 135 134*  K 3.4* 3.9  CL 102 104  CO2 27 22  GLUCOSE 116* 142*  BUN 4* 6  CREATININE 0.53 0.57  CALCIUM 7.8* 7.4*   PT/INR  Basename 02/06/12 1905  LABPROT 14.0  INR 1.06   CMP     Component Value Date/Time   NA 135 02/09/2012 0650   K 3.4* 02/09/2012 0650   CL 102 02/09/2012 0650   CO2 27 02/09/2012 0650   GLUCOSE 116* 02/09/2012 0650   BUN 4* 02/09/2012 0650   CREATININE 0.53 02/09/2012 0650   CALCIUM 7.8* 02/09/2012 0650   PROT 6.9 02/06/2012 1905   ALBUMIN 3.4* 02/06/2012 1905   AST 27 02/06/2012 1905   ALT 43* 02/06/2012 1905   ALKPHOS 87 02/06/2012 1905   BILITOT 0.4 02/06/2012 1905   GFRNONAA >90 02/09/2012 0650   GFRAA >90 02/09/2012 0650   Lipase  No results found for this basename: lipase       Studies/Results: No results found.  Anti-infectives: Anti-infectives     Start     Dose/Rate Route Frequency Ordered Stop   02/07/12 1000   ciprofloxacin (CIPRO) IVPB 400 mg        400 mg 200 mL/hr over 60 Minutes  Intravenous Every 12 hours 02/07/12 0454     02/07/12 0600   metroNIDAZOLE (FLAGYL) IVPB 500 mg        500 mg 100 mL/hr over 60 Minutes Intravenous Every 8 hours 02/07/12 0454     02/06/12 2300   ciprofloxacin (CIPRO) IVPB 400 mg        400 mg 200 mL/hr over 60 Minutes Intravenous  Once 02/06/12 2245 02/07/12 0008   02/06/12 2300   metroNIDAZOLE (FLAGYL) IVPB 500 mg        500 mg 100 mL/hr over 60 Minutes Intravenous  Once 02/06/12 2245 02/07/12 0009   02/06/12 2245   piperacillin-tazobactam (ZOSYN) IVPB 3.375 g  Status:  Discontinued        3.375 g 12.5 mL/hr over 240 Minutes Intravenous  Once 02/06/12 2232 02/06/12 2245           Assessment/Plan  1. S/p Hartman's for perf tics 2. Post op ileus 3. Hypokalemia  Plan: 1. con't NPO.  If patient starts throwing up will need NGT placed.  2. Replace K+, check BMET in am. 3. Mobilize and pulm toilet  LOS: 3 days    [place] E 02/09/2012

## 2012-02-09 NOTE — Progress Notes (Signed)
Still some nausea and little output from ostomy Patient examined and I agree with the assessment and plan  Violeta Gelinas, MD, MPH, FACS Pager: [phone]  02/09/2012 5:34 PM

## 2012-02-09 NOTE — Consult Note (Signed)
WOC consult Note Reason for Consult:Wound Vac dressing change to abdominal wound  Wound type:Open full thickness abdominal wound Pressure Ulcer POA: No Measurement:16x6x6 Wound AVW:[id number],91% red and 50% adipose tissue Drainage (amount, consistency, odor) Moderate amount of bloody drainage, no odor Periwound:Intact Dressing procedure/placement/frequency: Wound vac changed will one piece of black foam. Patient used PCA during procedure and tolerated dressing change well. Vac at continuous suction. Staff will change M-W-F  WOC ostomy consult  Stoma type/location: Lt colostomy Stomal assessment/size: Pouch intact but not removed. Stoma red and viable and appears to be flushed with skin level.  Output No stool or flatus. Scant bloody drainage noted in bag Ostomy pouching: 1pc..  Education provided:Patient offered demonstration of pouch change today because she was feeling poorly. Will attempt later this week [name] RN, WOC student / Cammie Mcgee, [facility]

## 2012-02-10 LAB — GLUCOSE, CAPILLARY
Glucose-Capillary: 125 mg/dL — ABNORMAL HIGH (ref 70–99)
Glucose-Capillary: 123 mg/dL — ABNORMAL HIGH (ref 70–99)
Glucose-Capillary: 121 mg/dL — ABNORMAL HIGH (ref 70–99)

## 2012-02-10 LAB — BASIC METABOLIC PANEL
Calcium: 7.9 mg/dL — ABNORMAL LOW (ref 8.4–10.5)
GFR calc Af Amer: >90 mL/min (ref >90)
GFR calc non Af Amer: >90 mL/min (ref >90)
Glucose, Bld: 135 mg/dL — ABNORMAL HIGH (ref 70–99)
Potassium: 3.5 mEq/L (ref 3.5–5.1)
Sodium: 138 mEq/L (ref 135–145)

## 2012-02-10 NOTE — Progress Notes (Signed)
Patient ID: [name], female   DOB: May 21, 1956, 56 y.o.   MRN: [id number] 3 Days Post-Op  Subjective: Pt still with nausea, but less.  No other major complaints  Objective: Vital signs in last 24 hours: Temp:  [97.4 F (36.3 C)-98.7 F (37.1 C)] 97.6 F (36.4 C) (04/11 0522) Pulse Rate:  [70-90] 80  (04/11 0522) Resp:  [15-23] 18  (04/11 0809) BP: (104-116)/(53-66) 104/64 mmHg (04/11 0522) SpO2:  [97 %-100 %] 98 % (04/11 0809) Last BM Date: 02/06/12  Intake/Output from previous day: 04/10 0701 - 04/11 0700 In: 2503 [I.V.:2303; IV Piggyback:200] Out: 825 [Urine:800; Drains:20; Stool:5] Intake/Output this shift:    PE: Abd: soft, less distended, some flatus in ostomy pouch, no other output.  Stoma pink.  Few BS.  VAC in place  Lab Results:   Basename 02/09/12 0650 02/08/12 0343  WBC 5.1 5.9  HGB 10.6* 11.5*  HCT 32.1* 35.2*  PLT 203 182   BMET  Basename 02/10/12 0630 02/09/12 0650  NA 138 135  K 3.5 3.4*  CL 105 102  CO2 27 27  GLUCOSE 135* 116*  BUN 4* 4*  CREATININE 0.50 0.53  CALCIUM 7.9* 7.8*   PT/INR No results found for this basename: LABPROT:2,INR:2 in the last 72 hours CMP     Component Value Date/Time   NA 138 02/10/2012 0630   K 3.5 02/10/2012 0630   CL 105 02/10/2012 0630   CO2 27 02/10/2012 0630   GLUCOSE 135* 02/10/2012 0630   BUN 4* 02/10/2012 0630   CREATININE 0.50 02/10/2012 0630   CALCIUM 7.9* 02/10/2012 0630   PROT 6.9 02/06/2012 1905   ALBUMIN 3.4* 02/06/2012 1905   AST 27 02/06/2012 1905   ALT 43* 02/06/2012 1905   ALKPHOS 87 02/06/2012 1905   BILITOT 0.4 02/06/2012 1905   GFRNONAA >90 02/10/2012 0630   GFRAA >90 02/10/2012 0630   Lipase  No results found for this basename: lipase       Studies/Results: No results found.  Anti-infectives: Anti-infectives     Start     Dose/Rate Route Frequency Ordered Stop   02/07/12 1000   ciprofloxacin (CIPRO) IVPB 400 mg        400 mg 200 mL/hr over 60 Minutes Intravenous Every 12 hours  02/07/12 0454     02/07/12 0600   metroNIDAZOLE (FLAGYL) IVPB 500 mg        500 mg 100 mL/hr over 60 Minutes Intravenous Every 8 hours 02/07/12 0454     02/06/12 2300   ciprofloxacin (CIPRO) IVPB 400 mg        400 mg 200 mL/hr over 60 Minutes Intravenous  Once 02/06/12 2245 02/07/12 0008   02/06/12 2300   metroNIDAZOLE (FLAGYL) IVPB 500 mg        500 mg 100 mL/hr over 60 Minutes Intravenous  Once 02/06/12 2245 02/07/12 0009   02/06/12 2245   piperacillin-tazobactam (ZOSYN) IVPB 3.375 g  Status:  Discontinued        3.375 g 12.5 mL/hr over 240 Minutes Intravenous  Once 02/06/12 2232 02/06/12 2245           Assessment/Plan  1. S/p Hartman's 2. Post op ileus  Plan: 1. Cont bowel rest, await resolution 2. Mobilize and pulm toilet   LOS: 4 days    Mignonne Afonso E 02/10/2012

## 2012-02-10 NOTE — Progress Notes (Signed)
Still not much in bag I spoke to the patient's husband Patient examined and I agree with the assessment and plan  Violeta Gelinas, MD, MPH, FACS Pager: [phone]  02/10/2012 5:26 PM

## 2012-02-11 LAB — GLUCOSE, CAPILLARY
Glucose-Capillary: 105 mg/dL — ABNORMAL HIGH (ref 70–99)
Glucose-Capillary: 112 mg/dL — ABNORMAL HIGH (ref 70–99)
Glucose-Capillary: 129 mg/dL — ABNORMAL HIGH (ref 70–99)
Glucose-Capillary: 142 mg/dL — ABNORMAL HIGH (ref 70–99)

## 2012-02-11 MED ORDER — DIPHENHYDRAMINE HCL 50 MG/ML IJ SOLN
12.5000 mg | Freq: Four times a day (QID) | INTRAMUSCULAR | Status: DC | PRN
  Administered 2012-02-11 – 2012-02-12 (×2): 25 mg via INTRAVENOUS
  Filled 2012-02-11 (×2): qty 1

## 2012-02-11 MED ORDER — DIPHENHYDRAMINE HCL 50 MG/ML IJ SOLN: 12.5000 mg | Freq: Four times a day (QID) | INTRAMUSCULAR | Status: DC | PRN

## 2012-02-11 MED ORDER — GUAIFENESIN ER 600 MG PO TB12
600.0000 mg | ORAL_TABLET | Freq: Two times a day (BID) | ORAL | Status: DC | PRN
  Filled 2012-02-11: qty 1

## 2012-02-11 MED ORDER — DIPHENHYDRAMINE HCL 12.5 MG/5ML PO ELIX
12.5000 mg | ORAL_SOLUTION | Freq: Four times a day (QID) | ORAL | Status: DC | PRN
  Administered 2012-02-13: 12.5 mg via ORAL
  Filled 2012-02-11: qty 5

## 2012-02-11 MED ORDER — MORPHINE SULFATE (PF) 1 MG/ML IV SOLN
INTRAVENOUS | Status: AC
  Administered 2012-02-11: 12:00:00
  Filled 2012-02-11: qty 25

## 2012-02-11 NOTE — Consult Note (Addendum)
WOC ostomy consult  Stoma type/location: Lt lower quadrant colostomy.  Stomal assessment/size: Stoma is red, viable and flushed at skin level. Size 1 inch Peristomal assessment: No peristomal breakdown noted but wafer noted to be wet indicating weepage Treatment options for stomal/peristomal skin: Barrier ring applied around stoma for added protection before applying pouch Output: Minimal amount of bloody liquid drainage  Ostomy pouching: 1pc  Education provided: Patient's spouse and daughter present during consult. Family instructed in changing, applying and emptying pouch. Instruction also given in how to cut opening for pouch. Spouse did return demonstration on how to open and close tip of pouch. Patient did not participate due to drowsiness from pain medication. [name] RN WOC Student/ Cammie Mcgee RN CWOCN

## 2012-02-11 NOTE — Progress Notes (Signed)
Patient ID: [name], female   DOB: [birth year], 56 y.o.   MRN: [id number] 4 Days Post-Op  Subjective: Pt feels better.  Less nausea.  Seems more awake today.  Objective: Vital signs in last 24 hours: Temp:  [97.4 F (36.3 C)-98.7 F (37.1 C)] 98.3 F (36.8 C) (04/12 4696) Pulse Rate:  [71-93] 93  (04/12 0608) Resp:  [16-20] 18  (04/12 0800) BP: (112-119)/(57-65) 118/65 mmHg (04/12 0608) SpO2:  [96 %-100 %] 96 % (04/12 0608) Last BM Date: 02/06/12  Intake/Output from previous day: 04/11 0701 - 04/12 0700 In: 3677.7 [I.V.:3377.7; IV Piggyback:300] Out: 850 [Urine:850] Intake/Output this shift:    PE: Abd: soft, few BS, less distended, minimal air in ostomy pouch.  No output.  VAC in place.  Will look at wound with VAC change today.  Lab Results:   [facility] 02/09/12 0650  WBC 5.1  HGB 10.6*  HCT 32.1*  PLT 203   BMET  Basename 02/10/12 0630 02/09/12 0650  NA 138 135  K 3.5 3.4*  CL 105 102  CO2 27 27  GLUCOSE 135* 116*  BUN 4* 4*  CREATININE 0.50 0.53  CALCIUM 7.9* 7.8*   PT/INR No results found for this basename: LABPROT:2,INR:2 in the last 72 hours CMP     Component Value Date/Time   NA 138 02/10/2012 0630   K 3.5 02/10/2012 0630   CL 105 02/10/2012 0630   CO2 27 02/10/2012 0630   GLUCOSE 135* 02/10/2012 0630   BUN 4* 02/10/2012 0630   CREATININE 0.50 02/10/2012 0630   CALCIUM 7.9* 02/10/2012 0630   PROT 6.9 02/06/2012 1905   ALBUMIN 3.4* 02/06/2012 1905   AST 27 02/06/2012 1905   ALT 43* 02/06/2012 1905   ALKPHOS 87 02/06/2012 1905   BILITOT 0.4 02/06/2012 1905   GFRNONAA >90 02/10/2012 0630   GFRAA >90 02/10/2012 0630   Lipase  No results found for this basename: lipase       Studies/Results: No results found.  Anti-infectives: Anti-infectives     Start     Dose/Rate Route Frequency Ordered Stop   02/07/12 1000   ciprofloxacin (CIPRO) IVPB 400 mg        400 mg 200 mL/hr over 60 Minutes Intravenous Every 12 hours 02/07/12 0454     02/07/12 0600    metroNIDAZOLE (FLAGYL) IVPB 500 mg        500 mg 100 mL/hr over 60 Minutes Intravenous Every 8 hours 02/07/12 0454     02/06/12 2300   ciprofloxacin (CIPRO) IVPB 400 mg        400 mg 200 mL/hr over 60 Minutes Intravenous  Once 02/06/12 2245 02/07/12 0008   02/06/12 2300   metroNIDAZOLE (FLAGYL) IVPB 500 mg        500 mg 100 mL/hr over 60 Minutes Intravenous  Once 02/06/12 2245 02/07/12 0009   02/06/12 2245   piperacillin-tazobactam (ZOSYN) IVPB 3.375 g  Status:  Discontinued        3.375 g 12.5 mL/hr over 240 Minutes Intravenous  Once 02/06/12 2232 02/06/12 2245           Assessment/Plan  1. S/p Hartman's 2. Post op ileus  Plan: 1. Cont to await bowel function 2. Cont VAC 3. Mobilize and pulm toilet   LOS: 5 days    Will Schier E 02/11/2012

## 2012-02-11 NOTE — Progress Notes (Signed)
Patient examined and I agree with the assessment and plan  Violeta Gelinas, MD, MPH, FACS Pager: [phone]  02/11/2012 6:01 PM

## 2012-02-11 NOTE — Consult Note (Signed)
WOC consult Note Reason for Consult: Follow up to vac dressing to open surgical abdominal wound Wound type: Full thickness Pressure Ulcer POA: No Measurement:See initial assessment Wound bed: Clean with adipose tissue present Drainage (amount, consistency, odor) Moderate amount of bloody drainage Periwound: Intact skin to surrounding wound Dressing procedure/placement/frequency: One piece of black sponge dressing applied to abdominal wound at 125 mmHg continuous suction  [name] RN WOC Student/ Cammie Mcgee RN CWOCN

## 2012-02-12 LAB — GLUCOSE, CAPILLARY: Glucose-Capillary: 140 mg/dL — ABNORMAL HIGH (ref 70–99)

## 2012-02-12 NOTE — Progress Notes (Signed)
5 Days Post-Op  Subjective:  Patient feels much less nauseated today, wanting some clear liquids;  Feels some "puffiness" to the colostomy bag  Objective: Vital signs in last 24 hours: Temp:  [98.2 F (36.8 C)-98.9 F (37.2 C)] 98.4 F (36.9 C) (04/13 0600) Pulse Rate:  [69-77] 69  (04/13 0600) Resp:  [16-20] 20  (04/13 0845) BP: (107-117)/(53-67) 115/67 mmHg (04/13 0600) SpO2:  [94 %-100 %] 97 % (04/13 0900) Last BM Date: 02/06/12  Intake/Output from previous day: 04/12 0701 - 04/13 0700 In: 2775 [I.V.:1673; IV Piggyback:1102] Out: 1660 [Urine:1650; Drains:10] Intake/Output this shift:    General appearance: alert, cooperative and no distress GI: soft; incisional tenderness; LLQ colostomy with some brown stool and flatus; minimal abdominal distention VAC with good seal - changed yesterday.  Lab Results:  No results found for this basename: WBC:2,HGB:2,HCT:2,PLT:2 in the last 72 hours BMET  Basename 02/10/12 0630  NA 138  K 3.5  CL 105  CO2 27  GLUCOSE 135*  BUN 4*  CREATININE 0.50  CALCIUM 7.9*   PT/INR No results found for this basename: LABPROT:2,INR:2 in the last 72 hours ABG No results found for this basename: PHART:2,PCO2:2,PO2:2,HCO3:2 in the last 72 hours  Studies/Results: No results found.  Anti-infectives: Anti-infectives     Start     Dose/Rate Route Frequency Ordered Stop   02/07/12 1000   ciprofloxacin (CIPRO) IVPB 400 mg        400 mg 200 mL/hr over 60 Minutes Intravenous Every 12 hours 02/07/12 0454     02/07/12 0600   metroNIDAZOLE (FLAGYL) IVPB 500 mg        500 mg 100 mL/hr over 60 Minutes Intravenous Every 8 hours 02/07/12 0454     02/06/12 2300   ciprofloxacin (CIPRO) IVPB 400 mg        400 mg 200 mL/hr over 60 Minutes Intravenous  Once 02/06/12 2245 02/07/12 0008   02/06/12 2300   metroNIDAZOLE (FLAGYL) IVPB 500 mg        500 mg 100 mL/hr over 60 Minutes Intravenous  Once 02/06/12 2245 02/07/12 0009   02/06/12 2245    piperacillin-tazobactam (ZOSYN) IVPB 3.375 g  Status:  Discontinued        3.375 g 12.5 mL/hr over 240 Minutes Intravenous  Once 02/06/12 2232 02/06/12 2245          Assessment/Plan: s/p Hartmann's procedure for perforated diverticulitis - post-operative ileus resolving Advance diet Clear liquids OOB to chair   LOS: 6 days    [name] K. 02/12/2012

## 2012-02-13 LAB — GLUCOSE, CAPILLARY
Glucose-Capillary: 138 mg/dL — ABNORMAL HIGH (ref 70–99)
Glucose-Capillary: 101 mg/dL — ABNORMAL HIGH (ref 70–99)
Glucose-Capillary: 97 mg/dL (ref 70–99)

## 2012-02-13 MED ORDER — PANTOPRAZOLE SODIUM 40 MG PO TBEC
40.0000 mg | DELAYED_RELEASE_TABLET | Freq: Every day | ORAL | Status: DC
  Administered 2012-02-13 – 2012-02-14 (×2): 40 mg via ORAL
  Filled 2012-02-13 (×2): qty 1

## 2012-02-13 MED ORDER — OXYCODONE-ACETAMINOPHEN 5-325 MG PO TABS
1.0000 | ORAL_TABLET | ORAL | Status: DC | PRN
  Administered 2012-02-13: 1 via ORAL
  Filled 2012-02-13: qty 1

## 2012-02-13 MED ORDER — MORPHINE SULFATE 2 MG/ML IJ SOLN: 2.0000 mg | INTRAMUSCULAR | Status: DC | PRN

## 2012-02-13 NOTE — Progress Notes (Signed)
6 Days Post-Op  Subjective: Feels better - still mildly nauseated, although she tolerated clears without difficulty More stool output from colostomy  Objective: Vital signs in last 24 hours: Temp:  [97.7 F (36.5 C)-98.3 F (36.8 C)] 97.7 F (36.5 C) (04/14 0522) Pulse Rate:  [75-85] 80  (04/14 0522) Resp:  [19-23] 22  (04/14 0820) BP: (109-119)/(55-75) 119/66 mmHg (04/14 0522) SpO2:  [95 %-100 %] 95 % (04/14 0820) Last BM Date: 02/06/12  Intake/Output from previous day: 04/13 0701 - 04/14 0700 In: 1613 [P.O.:358; I.V.:1255] Out: 3655 [Urine:3200; Drains:5; Stool:450] Intake/Output this shift: Total I/O In: -  Out: 400 [Urine:400]  General appearance: alert, cooperative and no distress GI: minimally distended; LLQ colostomy with large amount of pasty stool output VAC in place with good seal  Lab Results:  No results found for this basename: WBC:2,HGB:2,HCT:2,PLT:2 in the last 72 hours BMET No results found for this basename: NA:2,K:2,CL:2,CO2:2,GLUCOSE:2,BUN:2,CREATININE:2,CALCIUM:2 in the last 72 hours PT/INR No results found for this basename: LABPROT:2,INR:2 in the last 72 hours ABG No results found for this basename: PHART:2,PCO2:2,PO2:2,HCO3:2 in the last 72 hours  Studies/Results: No results found.  Anti-infectives: Anti-infectives     Start     Dose/Rate Route Frequency Ordered Stop   02/07/12 1000   ciprofloxacin (CIPRO) IVPB 400 mg        400 mg 200 mL/hr over 60 Minutes Intravenous Every 12 hours 02/07/12 0454     02/07/12 0600   metroNIDAZOLE (FLAGYL) IVPB 500 mg        500 mg 100 mL/hr over 60 Minutes Intravenous Every 8 hours 02/07/12 0454     02/06/12 2300   ciprofloxacin (CIPRO) IVPB 400 mg        400 mg 200 mL/hr over 60 Minutes Intravenous  Once 02/06/12 2245 02/07/12 0008   02/06/12 2300   metroNIDAZOLE (FLAGYL) IVPB 500 mg        500 mg 100 mL/hr over 60 Minutes Intravenous  Once 02/06/12 2245 02/07/12 0009   02/06/12 2245    piperacillin-tazobactam (ZOSYN) IVPB 3.375 g  Status:  Discontinued        3.375 g 12.5 mL/hr over 240 Minutes Intravenous  Once 02/06/12 2232 02/06/12 2245          Assessment/Plan: s/p Procedure(s) (LRB): EXPLORATORY LAPAROTOMY (Bilateral) PARTIAL COLECTOMY (N/A) COLOSTOMY (Left) Advance to full liquids D/C PCA - use PO pain meds Decrease IV fluids VAC change Monday/Wed/Friday Home soon - will need HHN for VAC, ostomy care   LOS: 7 days    Anatalia Kronk K. 02/13/2012

## 2012-02-14 LAB — GLUCOSE, CAPILLARY: Glucose-Capillary: 104 mg/dL — ABNORMAL HIGH (ref 70–99)

## 2012-02-14 MED ORDER — ONDANSETRON HCL 4 MG PO TABS: 4.0000 mg | ORAL_TABLET | Freq: Four times a day (QID) | ORAL | Status: AC | PRN

## 2012-02-14 MED ORDER — OXYCODONE-ACETAMINOPHEN 5-325 MG PO TABS: 1.0000 | ORAL_TABLET | ORAL | Status: DC | PRN

## 2012-02-14 MED ORDER — OXYCODONE-ACETAMINOPHEN 5-325 MG PO TABS: 1.0000 | ORAL_TABLET | ORAL | Status: AC | PRN

## 2012-02-14 NOTE — Discharge Planning (Signed)
Patient discharged home in stable condition. Verbalizes understanding of all discharge instructions, including home medications and follow up appointments. 

## 2012-02-14 NOTE — Discharge Instructions (Signed)
[facility]      [facility] [phone]  OPEN ABDOMINAL SURGERY: POST OP INSTRUCTIONS  Always review your discharge instruction sheet given to you by the facility where your surgery was performed.  IF YOU HAVE DISABILITY OR FAMILY LEAVE FORMS, YOU MUST BRING THEM TO THE OFFICE FOR PROCESSING.  PLEASE DO NOT GIVE THEM TO YOUR DOCTOR.  1. A prescription for pain medication may be given to you upon discharge.  Take your pain medication as prescribed, if needed.  If narcotic pain medicine is not needed, then you may take acetaminophen (Tylenol) or ibuprofen (Advil) as needed. 2. Take your usually prescribed medications unless otherwise directed. 3. If you need a refill on your pain medication, please contact your pharmacy. They will contact our office to request authorization.  Prescriptions will not be filled after 5pm or on week-ends. 4. You should follow a light diet the first few days after arrival home, such as soup and crackers, pudding, etc.unless your doctor has advised otherwise. A high-fiber, low fat diet can be resumed as tolerated.   Be sure to include lots of fluids daily. Most patients will experience some swelling and bruising on the chest and neck area.  Ice packs will help.  Swelling and bruising can take several days to resolve 5. Most patients will experience some swelling and bruising in the area of the incision. Ice pack will help. Swelling and bruising can take several days to resolve..  6. It is common to experience some constipation if taking pain medication after surgery.  Increasing fluid intake and taking a stool softener will usually help or prevent this problem from occurring.  A mild laxative (Milk of Magnesia or Miralax) should be taken according to package directions if there are no bowel movements after 48 hours. 7.  You may have steri-strips (small skin tapes) in place directly over the incision.  These strips should be left on the skin for 7-10 days.  If your  surgeon used skin glue on the incision, you may shower in 24 hours.  The glue will flake off over the next 2-3 weeks.  Any sutures or staples will be removed at the office during your follow-up visit. You may find that a light gauze bandage over your incision may keep your staples from being rubbed or pulled. You may shower and replace the bandage daily. 8. ACTIVITIES:  You may resume regular (light) daily activities beginning the next day--such as daily self-care, walking, climbing stairs--gradually increasing activities as tolerated.  You may have sexual intercourse when it is comfortable.  Refrain from any heavy lifting or straining until approved by your doctor. a. You may drive when you no longer are taking prescription pain medication, you can comfortably wear a seatbelt, and you can safely maneuver your car and apply brakes b. Return to Work: ___________________________________ 9. You should see your doctor in the office for a follow-up appointment approximately two weeks after your surgery.  Make sure that you call for this appointment within a day or two after you arrive home to insure a convenient appointment time. OTHER INSTRUCTIONS:  _____________________________________________________________ _____________________________________________________________  WHEN TO CALL YOUR DOCTOR: 1. Fever over 101.0 2. Inability to urinate 3. Nausea and/or vomiting 4. Extreme swelling or bruising 5. Continued bleeding from incision. 6. Increased pain, redness, or drainage from the incision. 7. Difficulty swallowing or breathing 8. Muscle cramping or spasms. 9. Numbness or tingling in hands or feet or around lips.  The clinic staff is available to   answer your questions during regular business hours.  Please don't hesitate to call and ask to speak to one of the nurses if you have concerns.  For further questions, please visit [email or web address]  Colostomy Home Guide A colostomy is an  opening for stool to leave your body when a medical condition prevents it from leaving through the usual opening (rectum). During a surgery, a piece of large intestine (colon) is brought through a hole in the abdominal wall. The new opening is called a stoma or ostomy. A bag or pouch fits over the stoma to catch stool and gas. Your stool may be liquid, somewhat pasty, or formed. CARING FOR YOUR STOMA  Normally, the stoma looks a lot like the inside of your cheek: pink, red, and moist. At first it may be swollen, but this swelling will decrease within 6 weeks. Keep the skin around your stoma clean and dry. You can gently wash your stoma and the skin around your stoma in the shower with a clean, soft washcloth. If you develop any skin irritation, your caregiver may give you a stoma powder or ointment to help heal the area. Do not use any products other than those specifically given to you by your caregiver.  Your stoma should not be uncomfortable. If you notice any stinging or burning, your pouch may be leaking, and the skin around your stoma may be coming into contact with stool. This can cause skin irritation. If you notice stinging, replace your pouch with a new one and discard the old one. OSTOMY POUCHES  The pouch that fits over the ostomy can be made up of either 1 or 2 pieces. A one-piece pouch has a skin barrier piece and the pouch itself in one unit. A two-piece pouch has a skin barrier with a separate pouch that snaps on and off of the skin barrier. Either way, you should empty the pouch when it is only ? to  full. Do not let more stool or gas build up. This could cause the pouch to leak. Some ostomy bags have a built-in gas release valve. Ostomy deodorizer (5 drops) can be put into the pouch to prevent odor. Some people use ostomy lubricant drops inside the pouch to help the stool slide out of the bag more easily and completely.  EMPTYING YOUR OSTOMY POUCH  You may get lessons on how to empty your  pouch from a wound-ostomy nurse before you leave the hospital. Here are the basic steps:  Wash your hands with soap and water.   Sit far back on the toilet.   Put several pieces of toilet paper into the toilet water. This will prevent splashing as you empty the stool into the toilet bowl.   Unclip or unvelcro the tail end of the pouch.   Unroll the tail and empty stool into the toilet.   Clean the tail with toilet paper.   Reroll the tail, and clip or velcro it closed.   Wash your hands again.  CHANGING YOUR OSTOMY POUCH  Change your ostomy pouch about every 3 to 4 days for the first 6 weeks, then every 5 to7 days. Always change the bag sooner if there is any leakage or you begin to notice any discomfort or irritation of the skin around the stoma. When possible, plan to change your ostomy pouch before eating or drinking as this will lessen the chance of stool coming out during the pouch change. A wound-ostomy nurse may teach you how to change your  pouch before you leave the hospital. Here are the basic steps:  Lay out your supplies.   Wash your hands with soap and water.   Carefully remove the old pouch.   Wash the stoma and allow it to dry. Men may be advised to shave any hair around the stoma very carefully. This will make the adhesive stick better.   Use the stoma measuring guide that comes with your pouch set to decide what size hole you will need to cut in the skin barrier piece. Choose the smallest possible size that will hold the stoma but will not touch it.   Use the guide to trace the circle on the back of the skin barrier piece. Cut out the hole.   Hold the skin barrier piece over the stoma to make sure the hole is the correct size.   Remove the adhesive paper backing from the skin barrier piece.   Squeeze stoma paste around the opening of the skin barrier piece.   Clean and dry the skin around the stoma again.   Carefully fit the skin barrier piece over your stoma.     If you are using a two-piece pouch, snap the pouch onto the skin barrier piece.   Close the tail of the pouch.   Put your hand over the top of the skin barrier piece to help warm it for about 5 minutes, so that it conforms to your body better.   Wash your hands again.  DIET TIPS   Continue to follow your usual diet.   Drink about eight 8 oz glasses of water each day.   You can prevent gas by eating slowly and chewing your food thoroughly.   If you feel concerned that you have too much gas, you can cut back on gas-producing foods, such as:   Spicy foods.   Onions and garlic.   Cruciferous vegetables (cabbage, broccoli, cauliflower, Brussels sprouts).   Beans and legumes.   Some cheeses.   Eggs.   Fish.   Bubbly (carbonated) drinks.   Chewing gum.  GENERAL TIPS   You can shower with or without the bag in place.   Always keep the bag on if you are bathing or swimming.   If your bag gets wet, you can dry it with a blow-dryer set to cool.   Avoid wearing tight clothing directly over your stoma so that it does not become irritated or bleed. Tight clothing can also prevent stool from draining into the pouch.   It is helpful to always have an extra skin barrier and pouch with you when traveling. Do not leave them anywhere too warm, as parts of them can melt.   Do not let your seat belt rest on your stoma. Try to keep the seat belt either above or below your stoma, or use a tiny pillow to cushion it.   You can still participate in sports, but you should avoid activities in which there is a risk of getting hit in the abdomen.   You can still have sex. It is a good idea to empty your pouch prior to sex. Some people and their partners feel very comfortable seeing the pouch during sex. Others choose to wear lingerie or a T-shirt that covers the device.  SEEK IMMEDIATE MEDICAL CARE IF:  You notice a change in the size or color of the stoma, especially if it becomes very  red, purple, black, or pale white.   You have bloody stools or bleeding  from the stoma.   You have abdominal pain, nausea, vomiting, or bloating.   There is anything unusual protruding from the stoma.   You have irritation or red skin around the stoma.   No stool is passing from the stoma.   You have diarrhea (requiring more frequent than normal pouch emptying).  Document Released: 10/21/2003 Document Revised: 10/07/2011 Document Reviewed: 03/17/2011 [facility] Patient Information 2012 [place], [place].  Vacuum-Assisted Closure Therapy Vacuum-assisted closure therapy is treatment to heal your wound using negative pressure. HOW DOES IT WORK?   The pump pulls fluid through a foam insert, into a tube and away from your wound.   The fluid goes into a canister.   The foam helps your wound heal faster.   The dressing covers and protects the wound.   The pump fluid collector should be changed as Designer, industrial/product suggests.  HOW DOES IT FEEL?   You might feel a little pulling when the pump is on.   You might feel a little discomfort when the dressing is taken off.  CAN I MOVE AROUND WITH VACUUM-ASSISTED CLOSURE THERAPY?  Yes, it has a backup battery.   Do not turn off the pump yourself!   Do not take off the dressing yourself!   You can wash or shower with the dressing.   You cannot take the pump into the shower.   Staff should not turn off the pump for more than 2 hours.   If the pump is off for more than 2 hours, your nurse must change your dressing.  THE ALARM IS SOUNDING! WHAT SHOULD I DO?   Stay calm.   Do not do anything with the pump or dressing.   Call your nurse to find out why.  WHEN SHOULD I CALL FOR HELP?   If there is a lot of blood in the tubing or canister.   You feel dizzy when you stand.   You have trouble thinking.   You have a fever.   Your wound is red or swollen or painful.   You have trouble breathing.   You have a rash or itching.   You  have pus or a bad smell from your wound.  HOW DO I GET READY TO GO HOME WITH A PUMP?  A trained person will talk to you and answer your questions about your vacuum-assisted closure therapy before you go home. If you have questions or do not know what to do when you go home, talk to your doctor or nurse.  Document Released: 09/30/2008 Document Revised: 10/07/2011 Document Reviewed: 10/01/2011 [facility] Patient Information 2012 [place], [place].

## 2012-02-14 NOTE — Consult Note (Signed)
WOC ostomy consult  Stoma type/location:  Colostomy  Stomal assessment/size: 11/4 inches, red and viable slightly above skin level. Peristomal assessment:  Intact  Output  Mod tan sem-iformed stool in pouch. Ostomy pouching: 1pc and barrier ring Education provided: Husband at bedside. He is able to appy one piece pouch and barrier ring without assistance, and empty and close velcro opening.  Reviewed pouching routines and ordering supplies.  Denies further questions at this time.  Placed on [facility] discharge program.    Abd wound beefy red. Mod thick tan drainage near 6 oclock of wound bed.  No odor. Removed Vac dressing and applied wet to dry dressing as requested by [facility].  Home health plans to deliver negative pressure device to home and apply.  Discussed wound appearance and plan of care with Barnetta Chapel, PA.   Cammie Mcgee, RN, MSN, [employer]  [phone]

## 2012-02-14 NOTE — Discharge Summary (Signed)
Physician Discharge Summary  Patient ID: [name] MRN: [id number] DOB/AGE: 58-06-1956 58 y.o.  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 She was taken to the OR on 02/07/12, found to have a focal perforation in sigmoid colon and underwent partial colectomy with colostomy and wound vac placement. Her post-operative course was uncomplicated except for transient ileus and hypokalemia that resolved prior to discharge. She was tolerating regular diet with minimal pain, well-controlled with oral percocet. She will continue with wound vac, CM was consulted for assistance with home health with plans to start wet-dry dressings pending insurance approval for vac care-plan changes three times weekly.   Consults: None  Significant Diagnostic Studies: radiology: CT scan: Moderate to large amount of free air within the abdomen and pelvis, reflecting perforated diverticulitis. 2. Relatively diffuse diverticulitis noted along the descending and proximal sigmoid colon. Collections of free air and trace  fluid noted at the proximal sigmoid colon, reflecting the point of perforation, measuring 2.1 cm and 1.3 cm in size, raising concern for early abscess development. 3. Scattered bilateral renal cysts noted. 4. Small to moderate hiatal hernia seen. 5. Relatively diffuse diverticulosis noted involving much of the  colon.   Treatments: surgery:  1. Exploratory laparotomy.  2. Sigmoid colectomy with end ostomy (Hartmann's procedure).  3. Application of wound  VAC.   Discharge Exam: Blood pressure 107/57, pulse 80, temperature 98.2 F (36.8 C), temperature source Oral, resp. rate 18, height 5\' 1"  (1.549 m), weight 196 lb 13.9 oz (89.3 kg), SpO2 95.00%. General appearance: alert, cooperative and no distress Head: Normocephalic, without obvious abnormality, atraumatic Resp: clear to auscultation bilaterally Cardio: RRR GI: soft, BS present, mild distended, moderate air in ostomy pouch. VAC in place. No surrounding erythema noted. No TTP. Extremities: extremities normal, atraumatic, no cyanosis or edema Neurologic: Grossly normal  Disposition: 01-Home or Self Care  Discharge Orders    Future Orders Please Complete By Expires   Discharge patient      Comments:   After tolerates full diet and CM has home health arrangement for wound dressings/vac.     Medication List  As of 02/14/2012  9:32 AM   STOP taking these medications         clindamycin 150 MG capsule      ondansetron 8 MG disintegrating tablet         TAKE these medications         HYDROcodone-acetaminophen 10-325 MG per tablet   Commonly known as: NORCO   Take 1 tablet by mouth every 6 (six) hours as needed.      ibuprofen 600 MG tablet   Commonly known as: ADVIL,MOTRIN   Take 1 tablet (600 mg total) by mouth every 6 (six) hours as needed for pain.      ondansetron 4 MG tablet   Commonly known as: ZOFRAN   Take 1 tablet (4 mg total) by mouth every 6 (six) hours as needed for nausea.      oxyCODONE-acetaminophen 5-325 MG per tablet   Commonly known as: PERCOCET   Take 1 tablet by mouth every 4 (four) hours as needed.  Follow-up Information    Follow up with Atilano Ina, MD,FACS. Schedule an appointment as soon as possible for a visit in 2 weeks.   Contact information:   [employer] [address], [address] [place] [address] [phone]          Signed: Lloyd Huger PGY-2 02/14/2012, 9:32 AM

## 2012-02-14 NOTE — Progress Notes (Signed)
Patient ID: [name], female   DOB: 02-01-1956, 56 y.o.   MRN: [id number] 7 Days Post-Op  Subjective: Tolerated full liquids well, no n/v. Ostomy output wnl. Some mild LLQ pain, positional.   Objective: Vital signs in last 24 hours: Temp:  [98.2 F (36.8 C)-98.6 F (37 C)] 98.2 F (36.8 C) (04/15 0528) Pulse Rate:  [80-88] 80  (04/15 0528) Resp:  [18-22] 18  (04/15 0528) BP: (107-117)/(55-63) 107/57 mmHg (04/15 0528) SpO2:  [95 %-99 %] 95 % (04/15 0528) Last BM Date: 02/13/12  Intake/Output from previous day: 04/14 0701 - 04/15 0700 In: 1960 [P.O.:360; I.V.:1200; IV Piggyback:400] Out: 4475 [Urine:3950; Drains:25; Stool:500] Intake/Output this shift:    PE: Abd: soft, BS present, mild distended, moderate air in ostomy pouch.  VAC in place.  No surrounding erythema noted. No TTP. PULM: Nonlabored CV: RRR  Lab Results:  No results found for this basename: WBC:2,HGB:2,HCT:2,PLT:2 in the last 72 hours BMET No results found for this basename: NA:2,K:2,CL:2,CO2:2,GLUCOSE:2,BUN:2,CREATININE:2,CALCIUM:2 in the last 72 hours PT/INR No results found for this basename: LABPROT:2,INR:2 in the last 72 hours CMP     Component Value Date/Time   NA 138 02/10/2012 0630   K 3.5 02/10/2012 0630   CL 105 02/10/2012 0630   CO2 27 02/10/2012 0630   GLUCOSE 135* 02/10/2012 0630   BUN 4* 02/10/2012 0630   CREATININE 0.50 02/10/2012 0630   CALCIUM 7.9* 02/10/2012 0630   PROT 6.9 02/06/2012 1905   ALBUMIN 3.4* 02/06/2012 1905   AST 27 02/06/2012 1905   ALT 43* 02/06/2012 1905   ALKPHOS 87 02/06/2012 1905   BILITOT 0.4 02/06/2012 1905   GFRNONAA >90 02/10/2012 0630   GFRAA >90 02/10/2012 0630   Lipase  No results found for this basename: lipase       Studies/Results: No results found.     . ciprofloxacin  400 mg Intravenous Q12H  . enoxaparin  40 mg Subcutaneous Q24H  . insulin aspart  0-15 Units Subcutaneous Q4H  . metronidazole  500 mg Intravenous Q8H  . pantoprazole  40 mg Oral Q1200    . DISCONTD: morphine   Intravenous Q4H  . DISCONTD: pantoprazole (PROTONIX) IV  40 mg Intravenous QHS   acetaminophen (TYLENOL) oral liquid 160 mg/5 mL, alum & mag hydroxide-simeth, diphenhydrAMINE, diphenhydrAMINE, diphenhydrAMINE, guaiFENesin, morphine injection, naloxone, ondansetron (ZOFRAN) IV, ondansetron, oxyCODONE-acetaminophen, phenol, promethazine, sodium chloride   Assessment/Plan 1. S/p Hartman's for perforated left diverticulitis 2. Colectomy with colostomy 3. Post op ileus, resolved  Plan: 1. Advance diet as tolerated to regular today. 2. Cont VAC, will have CM arrange [facility] RN for wound vac changes MWF. 3. Mobilize and pulm toilet  4. Prepare for DC in near future pending HH arrangements and diet advancement, possibly today. 5. Percocet for pain prn.    LOS: 8 days    [facility] PGY-2 02/14/2012

## 2012-02-14 NOTE — Progress Notes (Signed)
UR of chart completed.   HHRN and NPWT arranged with [facility] per pt choice and insurance network coverage. Address and phone number in EPIC confirmed as correct.

## 2012-02-15 ENCOUNTER — Telehealth (INDEPENDENT_AMBULATORY_CARE_PROVIDER_SITE_OTHER): Payer: Self-pay | Admitting: General Surgery

## 2012-02-15 NOTE — Telephone Encounter (Signed)
Lori, [facility], calling for notes/ orders for wound vac.  Notes copied and FAXd to her at [phone].

## 2012-02-15 NOTE — Telephone Encounter (Signed)
Appt made for patient on 02/21/12 @ 8:45 am.

## 2012-02-17 ENCOUNTER — Telehealth (INDEPENDENT_AMBULATORY_CARE_PROVIDER_SITE_OTHER): Payer: Self-pay | Admitting: General Surgery

## 2012-02-17 NOTE — Telephone Encounter (Signed)
Message copied by Liliana Cline on Thu Feb 17, 2012  9:29 AM ------      Message from: Marnette Burgess      Created: Thu Feb 17, 2012  9:04 AM      Contact: Sherrilyn Rist [facility]) [phone]       Called to confirm that you received a request for ostomy supplies, if so please fax it back, if not please call.

## 2012-02-17 NOTE — Telephone Encounter (Signed)
Received, to [employer] for signature.

## 2012-02-21 ENCOUNTER — Ambulatory Visit (INDEPENDENT_AMBULATORY_CARE_PROVIDER_SITE_OTHER): Payer: BC Managed Care – PPO | Admitting: General Surgery

## 2012-02-21 ENCOUNTER — Encounter (INDEPENDENT_AMBULATORY_CARE_PROVIDER_SITE_OTHER): Payer: Self-pay | Admitting: General Surgery

## 2012-02-21 VITALS — BP 110/78 | HR 70 | Temp 97.3°F | Resp 18 | Ht 61.0 in | Wt 164.0 lb

## 2012-02-21 DIAGNOSIS — Z933 Colostomy status: Secondary | ICD-10-CM

## 2012-02-21 DIAGNOSIS — Z09 Encounter for follow-up examination after completed treatment for conditions other than malignant neoplasm: Secondary | ICD-10-CM

## 2012-02-21 NOTE — Progress Notes (Signed)
Chief complaint: Postop  Procedure: Status post exploratory laparotomy, sigmoid colectomy with end colostomy April 8  History of Present Ilness: 56 year old Caucasian female comes in for her first postoperative appointment. She was discharged from the hospital on April 15. She has done well since discharge. She only takes pain medicine during her wound VAC change she has a wound VAC to her midline incision. She denies any fevers or chills. She denies any vomiting. She reports some nausea. She denies any ostomy problems. She is having good bulky ostomy output. She feels that her energy level is slowly increasing.Marland Kitchen   Physical Exam: BP 110/78  Pulse 70  Temp(Src) 97.3 F (36.3 C) (Temporal)  Resp 18  Ht 5\' 1"  (1.549 m)  Wt 164 lb (74.39 kg)  BMI 30.99 kg/m2  Gen: alert, NAD, non-toxic appearing, sitting in wheelchair Pupils: equal, no scleral icterus Pulm: Lungs clear to auscultation, symmetric chest rise CV: regular rate and rhythm Abd: soft, nontender, nondistended. Wound vac in midline. Ostomy on left- skin looks intact. Fair amount of stool in bag. No evid of hernia Ext: no edema, no calf tenderness Skin: no rash, no jaundice   Pathology: Acute diverticulitis with abscess, benign lymph nodes  Assessment and Plan: Status post exploratory laparotomy, sigmoid colectomy with end colostomy for perforated sigmoid diverticulitis-doing well We discussed her pathology.   Continue wound VAC to midline incision  I encouraged her to be a well balanced diet. I also encouraged her to do some daily walking. She was reminded not to lift push anything greater than 15 pounds until the week of May 20.  Followup 8 weeks  Mary Sella. Andrey Campanile, MD, FACS General, Bariatric, & Minimally Invasive Surgery [facility], [place]

## 2012-02-21 NOTE — Patient Instructions (Addendum)
Eat a well balance diet.  Try walking on a daily basis You can not lift, push, or pull anything greater than 15 pounds until after May 20  Diverticulitis A diverticulum is a small pouch or sac on the colon. Diverticulosis is the presence of these diverticula on the colon. Diverticulitis is the irritation (inflammation) or infection of diverticula. CAUSES  The colon and its diverticula contain bacteria. If food particles block the tiny opening to a diverticulum, the bacteria inside can grow and cause an increase in pressure. This leads to infection and inflammation and is called diverticulitis. SYMPTOMS   Abdominal pain and tenderness. Usually, the pain is located on the left side of your abdomen. However, it could be located elsewhere.   Fever.   Bloating.   Feeling sick to your stomach (nausea).   Throwing up (vomiting).   Abnormal stools.  DIAGNOSIS  Your caregiver will take a history and perform a physical exam. Since many things can cause abdominal pain, other tests may be necessary. Tests may include:  Blood tests.   Urine tests.   X-ray of the abdomen.   CT scan of the abdomen.  Sometimes, surgery is needed to determine if diverticulitis or other conditions are causing your symptoms. TREATMENT  Most of the time, you can be treated without surgery. Treatment includes:  Resting the bowels by only having liquids for a few days. As you improve, you will need to eat a low-fiber diet.   Intravenous (IV) fluids if you are losing body fluids (dehydrated).   Antibiotic medicines that treat infections may be given.   Pain and nausea medicine, if needed.   Surgery if the inflamed diverticulum has burst.  HOME CARE INSTRUCTIONS   Try a clear liquid diet (broth, tea, or water for as long as directed by your caregiver). You may then gradually begin a low-fiber diet as tolerated. A low-fiber diet is a diet with less than 10 grams of fiber. Choose the foods below to reduce fiber  in the diet:   White breads, cereals, rice, and pasta.   Cooked fruits and vegetables or soft fresh fruits and vegetables without the skin.   Ground or well-cooked tender beef, ham, veal, lamb, pork, or poultry.   Eggs and seafood.   After your diverticulitis symptoms have improved, your caregiver may put you on a high-fiber diet. A high-fiber diet includes 14 grams of fiber for every 1000 calories consumed. For a standard 2000 calorie diet, you would need 28 grams of fiber. Follow these diet guidelines to help you increase the fiber in your diet. It is important to slowly increase the amount fiber in your diet to avoid gas, constipation, and bloating.   Choose whole-grain breads, cereals, pasta, and brown rice.   Choose fresh fruits and vegetables with the skin on. Do not overcook vegetables because the more vegetables are cooked, the more fiber is lost.   Choose more nuts, seeds, legumes, dried peas, beans, and lentils.   Look for food products that have greater than 3 grams of fiber per serving on the Nutrition Facts label.   Take all medicine as directed by your caregiver.   If your caregiver has given you a follow-up appointment, it is very important that you go. Not going could result in lasting (chronic) or permanent injury, pain, and disability. If there is any problem keeping the appointment, call to reschedule.  SEEK MEDICAL CARE IF:   Your pain does not improve.   You have a hard  time advancing your diet beyond clear liquids.   Your bowel movements do not return to normal.  SEEK IMMEDIATE MEDICAL CARE IF:   Your pain becomes worse.   You have an oral temperature above 102 F (38.9 C), not controlled by medicine.   You have repeated vomiting.   You have bloody or black, tarry stools.   Symptoms that brought you to your caregiver become worse or are not getting better.  MAKE SURE YOU:   Understand these instructions.   Will watch your condition.   Will get  help right away if you are not doing well or get worse.  Document Released: 07/28/2005 Document Revised: 10/07/2011 Document Reviewed: 11/23/2010 [facility] Patient Information 2012 [place], [place].

## 2012-03-14 ENCOUNTER — Telehealth (INDEPENDENT_AMBULATORY_CARE_PROVIDER_SITE_OTHER): Payer: Self-pay

## 2012-03-14 NOTE — Telephone Encounter (Signed)
Christy from [facility] called in statin patients would measures 16x2x0.4 and didn't qualify or wound vac anymore. She dressed it yesterday with a wet to dry dressing. She wanted orders for either wet to dry dressing changes or hidragel TIW. I talked with Lesly Rubenstein and she said do wet to dry BID for rest of week. Dr. Andrey Campanile will be back from vacation next week and will call them if he wants anything different.

## 2012-04-05 ENCOUNTER — Encounter (INDEPENDENT_AMBULATORY_CARE_PROVIDER_SITE_OTHER): Payer: Self-pay | Admitting: General Surgery

## 2012-04-05 ENCOUNTER — Ambulatory Visit (INDEPENDENT_AMBULATORY_CARE_PROVIDER_SITE_OTHER): Payer: BC Managed Care – PPO | Admitting: General Surgery

## 2012-04-05 VITALS — BP 118/84 | HR 91 | Temp 97.5°F | Resp 14 | Ht 61.0 in | Wt 163.4 lb

## 2012-04-05 DIAGNOSIS — Z09 Encounter for follow-up examination after completed treatment for conditions other than malignant neoplasm: Secondary | ICD-10-CM

## 2012-04-05 NOTE — Progress Notes (Signed)
Chief complaint: Postop  Procedure: Status post exploratory laparotomy, sigmoid colectomy with end colostomy April 8  History of Present Ilness: 56 year old Caucasian female comes in for her second postoperative appointment. She was discharged from the hospital on April 15. She has done well since discharge. The wound vac has been discontinued. Her energy has greatly improved. . She denies any fevers or chills. She denies any vomiting. She reports no nausea. She denies any ostomy problems. She is having good bulky ostomy output. She is walking 1.5 miles per day.   Physical Exam: BP 118/84  Pulse 91  Temp(Src) 97.5 F (36.4 C) (Temporal)  Resp 14  Ht 5\' 1"  (1.549 m)  Wt 163 lb 6.4 oz (74.118 kg)  BMI 30.87 kg/m2  Gen: alert, NAD, non-toxic appearing, looks great, smiling Pupils: equal, no scleral icterus Pulm: Lungs clear to auscultation, symmetric chest rise CV: regular rate and rhythm Abd: soft, nontender, nondistended. Almost completely healed midline incision. At the umbilicus, the skin edges are uneven. Ostomy on left- skin looks intact. Fair amount of stool in bag. No evid of hernia Ext: no edema, no calf tenderness Skin: no rash, no jaundice   Pathology: Acute diverticulitis with abscess, benign lymph nodes  Assessment and Plan: Status post exploratory laparotomy, sigmoid colectomy with end colostomy for perforated sigmoid diverticulitis-doing great  I encouraged her to continue with her well balanced diet. I also encouraged her to continue with her daily walking. She was cleared to do pilates. My recommendation was to wait 6 months from the date of her surgery before reversal. We briefly discussed reversal surgery. If a wound vac is used, she has requested that Mepetal be part of her wound care. She'll f/u in late September for a planned reversal in late October after her [place] trip  Follow-up late September. Will get barium enema prior to next appt for part of surgical  planning  Mary Sella. Andrey Campanile, MD, FACS General, Bariatric, & Minimally Invasive Surgery [facility] Surgery, [place]

## 2012-04-05 NOTE — Patient Instructions (Signed)
Keep up the exercising

## 2012-04-14 ENCOUNTER — Telehealth (INDEPENDENT_AMBULATORY_CARE_PROVIDER_SITE_OTHER): Payer: Self-pay | Admitting: General Surgery

## 2012-04-14 NOTE — Telephone Encounter (Addendum)
Pt calling to report probable yeast infection all around her stoma where the bag adheres.  It is itchy and has odor.  She has tried OTC remedies x3 days without success.  Paged on-call Dr. Corliss Skains; ordered Nystatin cream, #30 gm tube, AAA BID, no refill.  Called to [facility]:  [phone].  Pt is aware.

## 2012-04-17 NOTE — Telephone Encounter (Signed)
error 

## 2012-05-15 ENCOUNTER — Encounter (INDEPENDENT_AMBULATORY_CARE_PROVIDER_SITE_OTHER): Payer: Self-pay | Admitting: General Surgery

## 2012-06-28 ENCOUNTER — Ambulatory Visit (HOSPITAL_COMMUNITY)
Admission: RE | Admit: 2012-06-28 | Discharge: 2012-06-28 | Disposition: A | Discharge status: Home / Self Care | Payer: BC Managed Care – PPO | Source: Ambulatory Visit | Attending: General Surgery | Admitting: General Surgery

## 2012-06-28 DIAGNOSIS — Z09 Encounter for follow-up examination after completed treatment for conditions other than malignant neoplasm: Secondary | ICD-10-CM

## 2012-06-28 DIAGNOSIS — Z01818 Encounter for other preprocedural examination: Secondary | ICD-10-CM | POA: Insufficient documentation

## 2012-06-28 DIAGNOSIS — Z933 Colostomy status: Secondary | ICD-10-CM | POA: Insufficient documentation

## 2012-07-12 ENCOUNTER — Encounter (INDEPENDENT_AMBULATORY_CARE_PROVIDER_SITE_OTHER): Payer: Self-pay | Admitting: General Surgery

## 2012-07-12 ENCOUNTER — Ambulatory Visit (INDEPENDENT_AMBULATORY_CARE_PROVIDER_SITE_OTHER): Payer: BC Managed Care – PPO | Admitting: General Surgery

## 2012-07-12 VITALS — BP 122/82 | HR 88 | Temp 98.5°F | Resp 16 | Ht 61.0 in | Wt 157.0 lb

## 2012-07-12 DIAGNOSIS — L309 Dermatitis, unspecified: Secondary | ICD-10-CM

## 2012-07-12 DIAGNOSIS — L259 Unspecified contact dermatitis, unspecified cause: Secondary | ICD-10-CM

## 2012-07-12 DIAGNOSIS — Z933 Colostomy status: Secondary | ICD-10-CM

## 2012-07-12 MED ORDER — HYDROCORTISONE 2.5 % EX LOTN: TOPICAL_LOTION | CUTANEOUS | Status: DC

## 2012-07-12 MED ORDER — PEG 3350-KCL-NABCB-NACL-NASULF 236 G PO SOLR: 4.0000 L | Freq: Once | ORAL | Status: AC

## 2012-07-12 NOTE — Patient Instructions (Signed)
  In addition- the morning of surgery, at least two hours prior to leaving home, use one fleets enema rectally

## 2012-07-12 NOTE — Progress Notes (Signed)
Patient ID: [name], female   DOB: [birth year], 56 y.o.   MRN: [id number]  Chief Complaint  Patient presents with  . Pre-op Exam    colostomy reversal    HPI [name] is a 56 y.o. female.  HPI 56 year old Caucasian female comes in for long-term followup. I last saw her in the office on June 5. Since she was seen last she states that she's been doing very well. She denies any fever, chills, nausea, vomiting, abdominal pain. She states that she has had some persistent irritation of her skin around her ostomy site. She has tried nystatin lotion, antifungal powder, as well as oral treatment for yeast infection without any changes or improvements. She reports an excellent appetite. She denies any medical procedures or diagnoses. She states that she had a complete physical several weeks ago and there is nothing alarming on her physical other than a low vitamin D level.  The patient had undergone an emergent sigmoid colectomy with end colostomy on April 8 for perforated sigmoid diverticulitis. She had previously undergone a colonoscopy one to 2 years prior to her surgery.  Past Medical History  Diagnosis Date  . Diverticulitis of large intestine with perforation 02/07/2012    Past Surgical History  Procedure Date  . Rotator cuff repair   . Tubal ligation   . Nose surgery   . Laparotomy 02/07/2012    Procedure: EXPLORATORY LAPAROTOMY;  Surgeon: Atilano Ina, MD,FACS;  Location: [facility] OR;  Service: General;  Laterality: Bilateral;  . Partial colectomy 02/07/2012    Procedure: PARTIAL COLECTOMY;  Surgeon: Atilano Ina, MD,FACS;  Location: [facility] OR;  Service: General;  Laterality: N/A;  . Colostomy 02/07/2012    Procedure: COLOSTOMY;  Surgeon: Atilano Ina, MD,FACS;  Location: [facility] OR;  Service: General;  Laterality: Left;    Family History  Problem Relation Age of Onset  . Diabetes Father   . Hypertension Father     Social History History  Substance Use Topics  . Smoking status:  Never Smoker   . Smokeless tobacco: Never Used  . Alcohol Use: No    No Known Allergies  Current Outpatient Prescriptions  Medication Sig Dispense Refill  . hydrocortisone 2.5 % lotion Apply to affected area 2 times daily  60 mL  1  . polyethylene glycol (GOLYTELY) 236 G solution Take 4,000 mLs by mouth once.  4000 mL  0    Review of Systems Review of Systems  Constitutional: Negative for fever, chills, fatigue and unexpected weight change.  HENT: Negative for hearing loss, congestion, sore throat, trouble swallowing and voice change.   Eyes: Negative for visual disturbance.  Respiratory: Negative for cough, shortness of breath and wheezing.   Cardiovascular: Negative for chest pain, palpitations and leg swelling.  Gastrointestinal: Negative for nausea, vomiting, abdominal pain, diarrhea, constipation, blood in stool, abdominal distention and anal bleeding.  Genitourinary: Negative for hematuria, vaginal bleeding and difficulty urinating.  Musculoskeletal: Negative for arthralgias.  Skin: Positive for rash. Negative for wound.  Neurological: Positive for headaches. Negative for tremors, seizures, syncope, speech difficulty, weakness, light-headedness and numbness.       No TIA, amaurosis fugax  Hematological: Negative for adenopathy. Does not bruise/bleed easily.  Psychiatric/Behavioral: Negative for confusion.    Blood pressure 122/82, pulse 88, temperature 98.5 F (36.9 C), temperature source Temporal, resp. rate 16, height 5\' 1"  (1.549 m), weight 157 lb (71.215 kg).  Physical Exam Physical Exam  Vitals reviewed. Constitutional: She is oriented to person,  place, and time. She appears well-developed and well-nourished. No distress.  HENT:  Head: Normocephalic and atraumatic.  Right Ear: External ear normal.  Eyes: Conjunctivae normal are normal. No scleral icterus.  Neck: Normal range of motion. Neck supple. No tracheal deviation present. No thyromegaly present.    Cardiovascular: Normal rate, regular rhythm and normal heart sounds.   Pulmonary/Chest: Effort normal and breath sounds normal. No respiratory distress. She has no wheezes.  Abdominal: Soft. Bowel sounds are normal. She exhibits no distension. There is no tenderness. There is no rebound.         No evidence of incisional hernia or parastomal hernia. No celluitis around ostomy. No fluctuance or induration. Tiny scattered rash along superior/inferior ostomy  Musculoskeletal: Normal range of motion. She exhibits no edema and no tenderness.  Neurological: She is alert and oriented to person, place, and time.  Skin: Skin is warm and dry. No rash noted. She is not diaphoretic. No erythema.  Psychiatric: She has a normal mood and affect. Her behavior is normal. Judgment and thought content normal.    Data Reviewed Path report - sigmoid diverticulitis with abscess Hospital discharge summary Last clinic note Barium enema - long rectal segment; some diverticular changes  Assessment    Sigmoid diverticulitis with perforation s/p colectomy and end colostomy Undesired colostomy Peristomal dermatitis    Plan    We reviewed her barium enema. There is about 6 inches of remaining sigmoid colon with diverticular disease. I have recommended that we remove this segment of colon during her colostomy reversal in order to decrease her chance of future episodes of diverticulitis. I discussed the procedure in detail.    We discussed the risks and benefits of surgery including, but not limited to bleeding, infection (such as wound infection, abdominal abscess), injury to surrounding structures, blood clot formation, urinary retention, incisional hernia, anastomotic stricture, anastomotic leak, anesthesia risks, pulmonary & cardiac complications such as pneumonia &/or heart attack, need for additional procedures, ileus, & prolonged hospitalization.  We discussed the typical postoperative recovery course, including  limitations & restrictions postoperatively. I explained that the likelihood of improvement in their symptoms is good.  We will plan to do a laparoscopic-assisted colostomy reversal and rigid proctoscopy.  She will be given a bowel prep and enema prior to surgery. She will be placed on perioperative entereg.   She would like to have surgery done after her trip to [place]. We will schedule her for surgery after October 19.  With respect to the skin irritation around her ostomy, this appears more consistent with a contact dermatitis. I gave her a prescription for hydrocortisone lotion. I also suggested that she contact ostomy nurse to consider changing her ostomy appliance to a different type.  Julie Sella. Andrey Campanile, MD, FACS General, Bariatric, & Minimally Invasive Surgery [facility], [place]        [facility] M 07/12/2012, 1:16 PM

## 2012-08-21 ENCOUNTER — Encounter (HOSPITAL_COMMUNITY): Payer: Self-pay | Admitting: Pharmacy Technician

## 2012-08-25 NOTE — Patient Instructions (Addendum)
20 FREDI HURTADO  08/25/2012   Your procedure is scheduled on: 09/01/12 0730am-1030am  Report to [facility] at 0530 AM.  Call this number if you have problems the morning of surgery: [phone]   Remember:   Do not eat food:After Midnight.  May have clear liquids:until Midnight .  Marland Kitchen  Take these medicines the morning of surgery with A SIP OF WATER:    Do not wear jewelry, make-up or nail polish.  Do not wear lotions, powders, or perfumes.   Do not shave 48 hours prior to surgery.   Do not bring valuables to the hospital.  Contacts, dentures or bridgework may not be worn into surgery.  Leave suitcase in the car. After surgery it may be brought to your room.  For patients admitted to the hospital, checkout time is 11:00 AM the day of discharge.                 SEE CHG INSTRUCTION SHEET    Please read over the following fact sheets that you were given: MRSA Information, coughing and deep breathing exercises, leg exercises

## 2012-08-28 ENCOUNTER — Other Ambulatory Visit (INDEPENDENT_AMBULATORY_CARE_PROVIDER_SITE_OTHER): Payer: Self-pay

## 2012-08-28 ENCOUNTER — Ambulatory Visit (HOSPITAL_COMMUNITY)
Admission: RE | Admit: 2012-08-28 | Discharge: 2012-08-28 | Disposition: A | Discharge status: Home / Self Care | Payer: BC Managed Care – PPO | Source: Ambulatory Visit | Attending: General Surgery | Admitting: General Surgery

## 2012-08-28 ENCOUNTER — Encounter (HOSPITAL_COMMUNITY): Payer: Self-pay

## 2012-08-28 ENCOUNTER — Telehealth (INDEPENDENT_AMBULATORY_CARE_PROVIDER_SITE_OTHER): Payer: Self-pay

## 2012-08-28 ENCOUNTER — Telehealth (INDEPENDENT_AMBULATORY_CARE_PROVIDER_SITE_OTHER): Payer: Self-pay | Admitting: General Surgery

## 2012-08-28 ENCOUNTER — Encounter (HOSPITAL_COMMUNITY)
Admission: RE | Admit: 2012-08-28 | Discharge: 2012-08-28 | Disposition: A | Discharge status: Home / Self Care | Payer: BC Managed Care – PPO | Source: Ambulatory Visit | Attending: General Surgery | Admitting: General Surgery

## 2012-08-28 DIAGNOSIS — M79606 Pain in leg, unspecified: Secondary | ICD-10-CM

## 2012-08-28 DIAGNOSIS — M79609 Pain in unspecified limb: Secondary | ICD-10-CM

## 2012-08-28 HISTORY — DX: Gastro-esophageal reflux disease without esophagitis: K21.9

## 2012-08-28 HISTORY — DX: Other specified postprocedural states: R11.2

## 2012-08-28 HISTORY — DX: Other specified postprocedural states: Z98.890

## 2012-08-28 HISTORY — DX: Hypothyroidism, unspecified: E03.9

## 2012-08-28 HISTORY — DX: Cardiac murmur, unspecified: R01.1

## 2012-08-28 LAB — CBC WITH DIFFERENTIAL/PLATELET
Basophils Absolute: 0 10*3/uL (ref 0.0–0.1)
Lymphocytes Relative: 36 % (ref 12–46)
Lymphs Abs: 2.1 10*3/uL (ref 0.7–4.0)
Neutro Abs: 3.3 10*3/uL (ref 1.7–7.7)
Platelets: 298 10*3/uL (ref 150–400)
RBC: 4.92 MIL/uL (ref 3.87–5.11)
RDW: 13.7 % (ref 11.5–15.5)
WBC: 5.8 10*3/uL (ref 4.0–10.5)

## 2012-08-28 LAB — COMPREHENSIVE METABOLIC PANEL
ALT: 135 U/L — ABNORMAL HIGH (ref 0–35)
AST: 72 U/L — ABNORMAL HIGH (ref 0–37)
Alkaline Phosphatase: 104 U/L (ref 39–117)
CO2: 27 mEq/L (ref 19–32)
Chloride: 103 mEq/L (ref 96–112)
GFR calc non Af Amer: >90 mL/min (ref >90)
Glucose, Bld: 90 mg/dL (ref 70–99)
Potassium: 4.2 mEq/L (ref 3.5–5.1)
Sodium: 140 mEq/L (ref 135–145)
Total Bilirubin: 0.3 mg/dL (ref 0.3–1.2)

## 2012-08-28 LAB — ABO/RH: ABO/RH(D): A POS

## 2012-08-28 NOTE — Telephone Encounter (Signed)
Vascular lab technician call results:  Pt is negative for DVT.  Dr. Andrey Campanile paged with results.

## 2012-08-28 NOTE — Telephone Encounter (Signed)
Pt called stating she took a 7hr car trip this weekend and now has left lower leg pain and is concerned she may have clot. No redness. No swelling. Reviewed with Dr Andrey Campanile. Per his order pt sent to [facility] vascular lab for doppler to r/o  DVT. They will call report.

## 2012-08-28 NOTE — Progress Notes (Signed)
*  PRELIMINARY RESULTS* Vascular Ultrasound Lower extremity venous duplex has been completed.  Preliminary findings: Bilateral:  No evidence of DVT, superficial thrombosis, or Baker's Cyst.  Called report to Britta Mccreedy, RN at Dr. Tawana Scale office.  Farrel Demark, RDMS, RVT  08/28/2012, 3:47 PM

## 2012-08-28 NOTE — Progress Notes (Signed)
CMET results sent to Inbox of Dr Gaynelle Adu.

## 2012-08-28 NOTE — Progress Notes (Signed)
Patient states she is concerned she may have a blood clot in left lower leg due to recent traveling trip. Instructed patient to call office of Dr [name] while here.  Patient called office regarding this and they will be calling her back.

## 2012-09-01 ENCOUNTER — Inpatient Hospital Stay (HOSPITAL_COMMUNITY)
Admission: RE | Admit: 2012-09-01 | Discharge: 2012-09-07 | DRG: 149 | Disposition: A | Discharge status: Home Health | Payer: BC Managed Care – PPO | Source: Ambulatory Visit | Attending: General Surgery | Admitting: General Surgery

## 2012-09-01 ENCOUNTER — Encounter (HOSPITAL_COMMUNITY): Payer: Self-pay | Admitting: Anesthesiology

## 2012-09-01 ENCOUNTER — Inpatient Hospital Stay (HOSPITAL_COMMUNITY): Payer: BC Managed Care – PPO | Admitting: Anesthesiology

## 2012-09-01 ENCOUNTER — Encounter (HOSPITAL_COMMUNITY)
Admission: RE | Disposition: A | Discharge status: Home / Self Care | Payer: Self-pay | Source: Ambulatory Visit | Attending: General Surgery

## 2012-09-01 ENCOUNTER — Encounter (HOSPITAL_COMMUNITY): Payer: Self-pay

## 2012-09-01 DIAGNOSIS — K5732 Diverticulitis of large intestine without perforation or abscess without bleeding: Principal | ICD-10-CM | POA: Diagnosis present

## 2012-09-01 DIAGNOSIS — Z433 Encounter for attention to colostomy: Secondary | ICD-10-CM

## 2012-09-01 DIAGNOSIS — K219 Gastro-esophageal reflux disease without esophagitis: Secondary | ICD-10-CM | POA: Diagnosis present

## 2012-09-01 DIAGNOSIS — Z9049 Acquired absence of other specified parts of digestive tract: Secondary | ICD-10-CM

## 2012-09-01 DIAGNOSIS — L259 Unspecified contact dermatitis, unspecified cause: Secondary | ICD-10-CM | POA: Diagnosis present

## 2012-09-01 HISTORY — PX: COLOSTOMY TAKEDOWN: SHX5258

## 2012-09-01 HISTORY — PX: LAPAROSCOPIC SIGMOID COLECTOMY: SHX5928

## 2012-09-01 HISTORY — PX: FLEXIBLE SIGMOIDOSCOPY: SHX5431

## 2012-09-01 LAB — TYPE AND SCREEN: ABO/RH(D): A POS

## 2012-09-01 SURGERY — CLOSURE, COLOSTOMY, LAPAROSCOPIC
Anesthesia: General | Site: Abdomen | Wound class: Clean Contaminated

## 2012-09-01 MED ORDER — BUPIVACAINE-EPINEPHRINE 0.25% -1:200000 IJ SOLN
INTRAMUSCULAR | Status: DC | PRN
  Administered 2012-09-01: 20 mL

## 2012-09-01 MED ORDER — CHLORHEXIDINE GLUCONATE 4 % EX LIQD
1.0000 "application " | Freq: Once | CUTANEOUS | Status: DC
  Filled 2012-09-01: qty 15

## 2012-09-01 MED ORDER — SCOPOLAMINE 1 MG/3DAYS TD PT72
MEDICATED_PATCH | TRANSDERMAL | Status: DC | PRN
  Administered 2012-09-01: 1 via TRANSDERMAL

## 2012-09-01 MED ORDER — MORPHINE SULFATE (PF) 1 MG/ML IV SOLN
INTRAVENOUS | Status: DC
  Administered 2012-09-01: 1 mg via INTRAVENOUS

## 2012-09-01 MED ORDER — ONDANSETRON HCL 4 MG/2ML IJ SOLN: 4.0000 mg | Freq: Four times a day (QID) | INTRAMUSCULAR | Status: DC | PRN

## 2012-09-01 MED ORDER — ONDANSETRON HCL 4 MG/2ML IJ SOLN
INTRAMUSCULAR | Status: DC | PRN
  Administered 2012-09-01: 4 mg via INTRAVENOUS

## 2012-09-01 MED ORDER — DEXTROSE 5 % IV SOLN
2.0000 g | INTRAVENOUS | Status: AC
  Administered 2012-09-01: 2 g via INTRAVENOUS

## 2012-09-01 MED ORDER — INFLUENZA VIRUS VACC SPLIT PF IM SUSP
0.5000 mL | Freq: Once | INTRAMUSCULAR | Status: AC
  Administered 2012-09-05: 0.5 mL via INTRAMUSCULAR
  Filled 2012-09-01 (×2): qty 0.5

## 2012-09-01 MED ORDER — KCL IN DEXTROSE-NACL 20-5-0.45 MEQ/L-%-% IV SOLN
INTRAVENOUS | Status: DC
  Administered 2012-09-01 (×2): via INTRAVENOUS
  Administered 2012-09-02 (×2): 100 mL/h via INTRAVENOUS
  Administered 2012-09-03 (×2): via INTRAVENOUS
  Administered 2012-09-03: 100 mL/h via INTRAVENOUS
  Administered 2012-09-04 – 2012-09-06 (×5): via INTRAVENOUS
  Filled 2012-09-01 (×15): qty 1000

## 2012-09-01 MED ORDER — HEPARIN SODIUM (PORCINE) 5000 UNIT/ML IJ SOLN
5000.0000 [IU] | Freq: Once | INTRAMUSCULAR | Status: AC
  Administered 2012-09-01: 5000 [IU] via SUBCUTANEOUS
  Filled 2012-09-01: qty 1

## 2012-09-01 MED ORDER — NEOSTIGMINE METHYLSULFATE 1 MG/ML IJ SOLN
INTRAMUSCULAR | Status: DC | PRN
  Administered 2012-09-01: 4 mg via INTRAVENOUS

## 2012-09-01 MED ORDER — ROCURONIUM BROMIDE 100 MG/10ML IV SOLN
INTRAVENOUS | Status: DC | PRN
  Administered 2012-09-01: 10 mg via INTRAVENOUS
  Administered 2012-09-01: 5 mg via INTRAVENOUS
  Administered 2012-09-01: 50 mg via INTRAVENOUS

## 2012-09-01 MED ORDER — HYDROMORPHONE 0.3 MG/ML IV SOLN
INTRAVENOUS | Status: DC
  Administered 2012-09-01: 13:00:00 via INTRAVENOUS
  Administered 2012-09-01: 4.8 mg via INTRAVENOUS
  Administered 2012-09-01: 1.5 mg via INTRAVENOUS
  Administered 2012-09-01: 25 mg via INTRAVENOUS
  Administered 2012-09-01: 13:00:00 via INTRAVENOUS
  Administered 2012-09-02: 0.9 mg via INTRAVENOUS
  Administered 2012-09-02: 0.6 mg via INTRAVENOUS
  Administered 2012-09-02: 0.2 mg via INTRAVENOUS
  Administered 2012-09-02: 1.2 mg via INTRAVENOUS
  Administered 2012-09-02: 1.8 mg via INTRAVENOUS
  Administered 2012-09-03: 04:00:00 via INTRAVENOUS
  Administered 2012-09-03: 0.9 mg via INTRAVENOUS
  Administered 2012-09-03: 1.8 mg via INTRAVENOUS
  Administered 2012-09-03: 1.5 mg via INTRAVENOUS
  Administered 2012-09-03: 0.821 mg via INTRAVENOUS
  Administered 2012-09-03: 0.9 mg via INTRAVENOUS
  Administered 2012-09-03: 1.5 mg via INTRAVENOUS
  Administered 2012-09-04: 2.7 mg via INTRAVENOUS
  Administered 2012-09-04: 1.07 mg via INTRAVENOUS
  Administered 2012-09-04: 25 mg via INTRAVENOUS
  Administered 2012-09-04: 2.1 mg via INTRAVENOUS
  Administered 2012-09-04: 25 mg via INTRAVENOUS
  Administered 2012-09-04: 1.8 mg via INTRAVENOUS
  Administered 2012-09-04: 1.69 mg via INTRAVENOUS
  Administered 2012-09-05: 0.6 mg via INTRAVENOUS
  Filled 2012-09-01 (×4): qty 25

## 2012-09-01 MED ORDER — DIPHENHYDRAMINE HCL 12.5 MG/5ML PO ELIX
12.5000 mg | ORAL_SOLUTION | Freq: Four times a day (QID) | ORAL | Status: DC | PRN
  Filled 2012-09-01: qty 5

## 2012-09-01 MED ORDER — LACTATED RINGERS IV SOLN
INTRAVENOUS | Status: DC | PRN
  Administered 2012-09-01 (×3): via INTRAVENOUS

## 2012-09-01 MED ORDER — HYDROMORPHONE HCL PF 1 MG/ML IJ SOLN: 0.2500 mg | INTRAMUSCULAR | Status: DC | PRN

## 2012-09-01 MED ORDER — PANTOPRAZOLE SODIUM 40 MG IV SOLR
40.0000 mg | INTRAVENOUS | Status: DC
  Administered 2012-09-01 – 2012-09-05 (×5): 40 mg via INTRAVENOUS
  Filled 2012-09-01 (×6): qty 40

## 2012-09-01 MED ORDER — LACTATED RINGERS IV SOLN: INTRAVENOUS | Status: DC

## 2012-09-01 MED ORDER — GLYCOPYRROLATE 0.2 MG/ML IJ SOLN
INTRAMUSCULAR | Status: DC | PRN
  Administered 2012-09-01: .7 mg via INTRAVENOUS

## 2012-09-01 MED ORDER — SODIUM CHLORIDE 0.9 % IJ SOLN: 9.0000 mL | INTRAMUSCULAR | Status: DC | PRN

## 2012-09-01 MED ORDER — DIPHENHYDRAMINE HCL 50 MG/ML IJ SOLN
12.5000 mg | Freq: Four times a day (QID) | INTRAMUSCULAR | Status: DC | PRN
  Administered 2012-09-02 – 2012-09-06 (×5): 12.5 mg via INTRAVENOUS
  Filled 2012-09-01 (×6): qty 1

## 2012-09-01 MED ORDER — HYDROMORPHONE 0.3 MG/ML IV SOLN
INTRAVENOUS | Status: AC
  Filled 2012-09-01: qty 25

## 2012-09-01 MED ORDER — NALOXONE HCL 0.4 MG/ML IJ SOLN: 0.4000 mg | INTRAMUSCULAR | Status: DC | PRN

## 2012-09-01 MED ORDER — DIPHENHYDRAMINE HCL 50 MG/ML IJ SOLN: 12.5000 mg | Freq: Four times a day (QID) | INTRAMUSCULAR | Status: DC | PRN

## 2012-09-01 MED ORDER — PROPOFOL 10 MG/ML IV BOLUS
INTRAVENOUS | Status: DC | PRN
  Administered 2012-09-01: 150 mg via INTRAVENOUS

## 2012-09-01 MED ORDER — ALVIMOPAN 12 MG PO CAPS
12.0000 mg | ORAL_CAPSULE | Freq: Once | ORAL | Status: AC
  Administered 2012-09-01: 12 mg via ORAL
  Filled 2012-09-01: qty 1

## 2012-09-01 MED ORDER — DIPHENHYDRAMINE HCL 12.5 MG/5ML PO ELIX
12.5000 mg | ORAL_SOLUTION | Freq: Four times a day (QID) | ORAL | Status: DC | PRN
  Administered 2012-09-04 – 2012-09-07 (×2): 12.5 mg via ORAL
  Filled 2012-09-01: qty 5

## 2012-09-01 MED ORDER — BUPIVACAINE-EPINEPHRINE (PF) 0.5% -1:200000 IJ SOLN
INTRAMUSCULAR | Status: AC
  Filled 2012-09-01: qty 20

## 2012-09-01 MED ORDER — HEPARIN SODIUM (PORCINE) 5000 UNIT/ML IJ SOLN
5000.0000 [IU] | Freq: Three times a day (TID) | INTRAMUSCULAR | Status: DC
  Administered 2012-09-02 – 2012-09-07 (×16): 5000 [IU] via SUBCUTANEOUS
  Filled 2012-09-01 (×19): qty 1

## 2012-09-01 MED ORDER — FENTANYL CITRATE 0.05 MG/ML IJ SOLN
INTRAMUSCULAR | Status: DC | PRN
  Administered 2012-09-01 (×6): 50 ug via INTRAVENOUS
  Administered 2012-09-01: 100 ug via INTRAVENOUS
  Administered 2012-09-01: 50 ug via INTRAVENOUS

## 2012-09-01 MED ORDER — ALVIMOPAN 12 MG PO CAPS
12.0000 mg | ORAL_CAPSULE | Freq: Two times a day (BID) | ORAL | Status: DC
  Administered 2012-09-02 – 2012-09-06 (×9): 12 mg via ORAL
  Filled 2012-09-01 (×10): qty 1

## 2012-09-01 MED ORDER — MIDAZOLAM HCL 5 MG/5ML IJ SOLN
INTRAMUSCULAR | Status: DC | PRN
  Administered 2012-09-01: 2 mg via INTRAVENOUS

## 2012-09-01 MED ORDER — DEXAMETHASONE SODIUM PHOSPHATE 10 MG/ML IJ SOLN
INTRAMUSCULAR | Status: DC | PRN
  Administered 2012-09-01: 10 mg via INTRAVENOUS

## 2012-09-01 MED ORDER — ONDANSETRON HCL 4 MG/2ML IJ SOLN
4.0000 mg | Freq: Four times a day (QID) | INTRAMUSCULAR | Status: DC | PRN
  Administered 2012-09-02 – 2012-09-07 (×10): 4 mg via INTRAVENOUS
  Filled 2012-09-01 (×10): qty 2

## 2012-09-01 MED ORDER — PROMETHAZINE HCL 25 MG/ML IJ SOLN: 6.2500 mg | INTRAMUSCULAR | Status: DC | PRN

## 2012-09-01 MED ORDER — PNEUMOCOCCAL VAC POLYVALENT 25 MCG/0.5ML IJ INJ
0.5000 mL | INJECTION | Freq: Once | INTRAMUSCULAR | Status: AC
  Administered 2012-09-05: 0.5 mL via INTRAMUSCULAR
  Filled 2012-09-01: qty 0.5

## 2012-09-01 SURGICAL SUPPLY — 78 items
APPLIER CLIP 5 13 M/L LIGAMAX5 (MISCELLANEOUS)
APPLIER CLIP ROT 10 11.4 M/L (STAPLE)
BLADE EXTENDED COATED 6.5IN (ELECTRODE) ×3 IMPLANT
BLADE HEX COATED 2.75 (ELECTRODE) ×3 IMPLANT
BLADE SURG SZ10 CARB STEEL (BLADE) ×3 IMPLANT
CANISTER SUCTION 2500CC (MISCELLANEOUS) ×3 IMPLANT
CELLS DAT CNTRL 66122 CELL SVR (MISCELLANEOUS) IMPLANT
CLAMP ENDO BABCK 10MM (STAPLE) IMPLANT
CLIP APPLIE 5 13 M/L LIGAMAX5 (MISCELLANEOUS) IMPLANT
CLIP APPLIE ROT 10 11.4 M/L (STAPLE) IMPLANT
CLOTH BEACON ORANGE TIMEOUT ST (SAFETY) ×3 IMPLANT
CONNECTOR 5 IN 1 STRAIGHT STRL (MISCELLANEOUS) IMPLANT
COVER MAYO STAND STRL (DRAPES) ×3 IMPLANT
COVER SURGICAL LIGHT HANDLE (MISCELLANEOUS) ×3 IMPLANT
DECANTER SPIKE VIAL GLASS SM (MISCELLANEOUS) ×6 IMPLANT
DEVICE TROCAR PUNCTURE CLOSURE (ENDOMECHANICALS) IMPLANT
DRAPE LAPAROSCOPIC ABDOMINAL (DRAPES) ×3 IMPLANT
DRAPE LG THREE QUARTER DISP (DRAPES) ×3 IMPLANT
DRAPE WARM FLUID 44X44 (DRAPE) ×6 IMPLANT
ELECT REM PT RETURN 9FT ADLT (ELECTROSURGICAL) ×3
ELECTRODE REM PT RTRN 9FT ADLT (ELECTROSURGICAL) ×2 IMPLANT
ENSEAL DEVICE STD TIP 35CM (ENDOMECHANICALS) IMPLANT
GLOVE BIOGEL M STRL SZ7.5 (GLOVE) IMPLANT
GLOVE BIOGEL PI IND STRL 7.0 (GLOVE) ×2 IMPLANT
GLOVE BIOGEL PI INDICATOR 7.0 (GLOVE) ×1
GOWN STRL NON-REIN LRG LVL3 (GOWN DISPOSABLE) ×6 IMPLANT
GOWN STRL REIN XL XLG (GOWN DISPOSABLE) ×6 IMPLANT
HAND ACTIVATED (MISCELLANEOUS) ×3 IMPLANT
KIT BASIN OR (CUSTOM PROCEDURE TRAY) ×3 IMPLANT
LEGGING LITHOTOMY PAIR STRL (DRAPES) ×3 IMPLANT
LIGASURE IMPACT 36 18CM CVD LR (INSTRUMENTS) IMPLANT
NS IRRIG 1000ML POUR BTL (IV SOLUTION) ×6 IMPLANT
PENCIL BUTTON HOLSTER BLD 10FT (ELECTRODE) ×3 IMPLANT
RELOAD BLUE (STAPLE) ×6 IMPLANT
RTRCTR WOUND ALEXIS 18CM MED (MISCELLANEOUS)
SCALPEL HARMONIC ACE (MISCELLANEOUS) ×3 IMPLANT
SCISSORS LAP 5X35 DISP (ENDOMECHANICALS) ×3 IMPLANT
SET IRRIG TUBING LAPAROSCOPIC (IRRIGATION / IRRIGATOR) ×3 IMPLANT
SLEEVE XCEL OPT CAN 5 100 (ENDOMECHANICALS) ×6 IMPLANT
SOLUTION ANTI FOG 6CC (MISCELLANEOUS) ×3 IMPLANT
SPONGE GAUZE 4X4 12PLY (GAUZE/BANDAGES/DRESSINGS) ×6 IMPLANT
SPONGE LAP 18X18 X RAY DECT (DISPOSABLE) ×6 IMPLANT
STAPLE ECHEON FLEX 60 POW ENDO (STAPLE) ×3 IMPLANT
STAPLER VISISTAT 35W (STAPLE) ×3 IMPLANT
STRIP CLOSURE SKIN 1/2X4 (GAUZE/BANDAGES/DRESSINGS) IMPLANT
SUCTION POOLE TIP (SUCTIONS) ×3 IMPLANT
SUT NOVA NAB GS-21 0 18 T12 DT (SUTURE) ×6 IMPLANT
SUT PDS AB 1 CT1 27 (SUTURE) IMPLANT
SUT PDS AB 1 CTX 36 (SUTURE) IMPLANT
SUT PDS AB 4-0 SH 27 (SUTURE) IMPLANT
SUT PROLENE 2 0 KS (SUTURE) IMPLANT
SUT SILK 2 0 (SUTURE) ×1
SUT SILK 2 0 SH CR/8 (SUTURE) ×3 IMPLANT
SUT SILK 2-0 18XBRD TIE 12 (SUTURE) ×2 IMPLANT
SUT SILK 3 0 (SUTURE) ×1
SUT SILK 3 0 SH CR/8 (SUTURE) ×3 IMPLANT
SUT SILK 3-0 18XBRD TIE 12 (SUTURE) ×2 IMPLANT
SUT VIC AB 2-0 CT1 27 (SUTURE)
SUT VIC AB 2-0 CT1 27XBRD (SUTURE) IMPLANT
SUT VIC AB 3-0 PS2 18 (SUTURE)
SUT VIC AB 3-0 PS2 18XBRD (SUTURE) IMPLANT
SUT VIC AB 4-0 SH 18 (SUTURE) IMPLANT
SUT VICRYL 0 UR6 27IN ABS (SUTURE) ×3 IMPLANT
SYR 30ML LL (SYRINGE) ×3 IMPLANT
SYR BULB IRRIGATION 50ML (SYRINGE) ×3 IMPLANT
SYSTEM CONVERTIBLE TROCAR (TROCAR) ×3 IMPLANT
TOWEL OR 17X26 10 PK STRL BLUE (TOWEL DISPOSABLE) ×6 IMPLANT
TRAY FOLEY CATH 14FRSI W/METER (CATHETERS) ×3 IMPLANT
TRAY LAP CHOLE (CUSTOM PROCEDURE TRAY) ×3 IMPLANT
TROCAR BLADELESS OPT 5 100 (ENDOMECHANICALS) ×3 IMPLANT
TROCAR HASSON GELL 12X100 (TROCAR) IMPLANT
TROCAR Z-THREAD FIOS 11X100 BL (TROCAR) IMPLANT
TROCAR Z-THREAD FIOS 5X100MM (TROCAR) IMPLANT
TROCAR Z-THREAD SLEEVE 11X100 (TROCAR) IMPLANT
TUBING CONNECTING 10 (TUBING) ×3 IMPLANT
TUBING FILTER THERMOFLATOR (ELECTROSURGICAL) ×3 IMPLANT
YANKAUER SUCT BULB TIP 10FT TU (MISCELLANEOUS) ×3 IMPLANT
YANKAUER SUCT BULB TIP NO VENT (SUCTIONS) ×3 IMPLANT

## 2012-09-01 NOTE — Preoperative (Signed)
Beta Blockers   Reason not to administer Beta Blockers:Not Applicable 

## 2012-09-01 NOTE — Progress Notes (Signed)
Patient presented this am with sore throat and sinus drainage. Low grade temp of 99. She wants to proceed with surgery. Dr Andrey Campanile is not on call this am.

## 2012-09-01 NOTE — Care Management Note (Addendum)
    Page 1 of 2   09/07/2012     1:23:19 PM   CARE MANAGEMENT NOTE 09/07/2012  Patient:  [name], [name]   Account Number:  [id number]  Date Initiated:  09/01/2012  Documentation initiated by:  Lorenda Ishihara  Subjective/Objective Assessment:   56 yo female admitted s/p colectomy with colostomy takedown. PTA lived at home with spouse.     Action/Plan:   home when stable, hx with [facility]   Anticipated DC Date:  09/06/2012   Anticipated DC Plan:  HOME W HOME HEALTH SERVICES      DC Planning Services  CM consult      [facility] Choice  HOME HEALTH   Choice offered to / List presented to:  C-1 Patient        HH arranged  HH-1 RN  HH-10 DISEASE MANAGEMENT      HH agency  [facility]   Status of service:  Completed, signed off Medicare Important Message given?  NA - LOS <3 / Initial given by admissions (If response is "NO", the following Medicare IM given date fields will be blank) Date Medicare IM given:   Date Additional Medicare IM given:    Discharge Disposition:  HOME W HOME HEALTH SERVICES  Per UR Regulation:  Reviewed for med. necessity/level of care/duration of stay  If discussed at Long Length of Stay Meetings, dates discussed:    Comments:  09-07-12 Lorenda Ishihara RN CM 1100 Patient requesting to see CM regarding [facility] agency. Patient does not want [facility] now, provided patient with a list for choice. Patient chose several agencies but each on denied due to insurance or lack of available staff. The last agency contacted was [facility] who could provide the services but wanted prior auth from [facility] before committing to services. Discussed with patient, she was agreeable to [facility] for services, she did not want to wait for insurance auth. She was d/ced home. Information and orders faxed to [facility], awaiting confirmation of auth. RN aware prior to d/c.  09-04-12 Lorenda Ishihara RN CM 1000 Spoke with patient at bedside. Discussed d/c plans and currently wound care.  Patient agreeable to [facility] services to assist with wound care. Has used [facility] in the past and would like to use them again. Contacted Kirsten with [facility] to arrange. Anticipate home in the next several days if continues to progress. Awaiting final orders.

## 2012-09-01 NOTE — Anesthesia Postprocedure Evaluation (Signed)
  Anesthesia Post-op Note  Patient: [name]  Procedure(s) Performed: Procedure(s) (LRB): LAPAROSCOPIC COLOSTOMY TAKEDOWN (N/A) LAPAROSCOPIC SIGMOID COLECTOMY () FLEXIBLE SIGMOIDOSCOPY ()  Patient Location: PACU  Anesthesia Type: General  Level of Consciousness: awake and alert   Airway and Oxygen Therapy: Patient Spontanous Breathing  Post-op Pain: mild  Post-op Assessment: Post-op Vital signs reviewed, Patient's Cardiovascular Status Stable, Respiratory Function Stable, Patent Airway and No signs of Nausea or vomiting  Post-op Vital Signs: stable  Complications: No apparent anesthesia complications

## 2012-09-01 NOTE — Transfer of Care (Signed)
Immediate Anesthesia Transfer of Care Note  Patient: [name]  Procedure(s) Performed: Procedure(s) (LRB) with comments: LAPAROSCOPIC COLOSTOMY TAKEDOWN (N/A) - Lap Assisted Colostomy Reversal  LAPAROSCOPIC SIGMOID COLECTOMY () FLEXIBLE SIGMOIDOSCOPY ()  Patient Location: PACU  Anesthesia Type:General  Level of Consciousness: awake and patient cooperative  Airway & Oxygen Therapy: Patient Spontanous Breathing and Patient connected to face mask oxygen  Post-op Assessment: Report given to PACU RN and Post -op Vital signs reviewed and stable  Post vital signs: Reviewed and stable  Complications: No apparent anesthesia complications

## 2012-09-01 NOTE — Brief Op Note (Signed)
09/01/2012  10:53 AM  PATIENT:  [name]  56 y.o. female  PRE-OPERATIVE DIAGNOSIS:  h/o perf diverticulitis, undesired colostomy  POST-OPERATIVE DIAGNOSIS:  h/o perf diverticulitis, undesired colostomy  PROCEDURE:  Procedure(s) (LRB) with comments: LAPAROSCOPIC COLOSTOMY TAKEDOWN (N/A) - Lap Assisted Colostomy Reversal  LAPAROSCOPIC SIGMOID COLECTOMY () FLEXIBLE SIGMOIDOSCOPY ()  SURGEON:  Surgeon(s) and Role:    * Atilano Ina, MD,FACS - Primary  PHYSICIAN ASSISTANT: none  ASSISTANTS: Ovidio Kin, MD FACS   ANESTHESIA:   general  EBL:  Total I/O In: 2000 [I.V.:2000] Out: 700 [Urine:500; Blood:200]  BLOOD ADMINISTERED:none  DRAINS: Urinary Catheter (Foley)   LOCAL MEDICATIONS USED:  MARCAINE     SPECIMEN:  Source of Specimen:  sigmoid colon - stitch proximal margin; anastomotic doughnuts  DISPOSITION OF SPECIMEN:  PATHOLOGY  COUNTS:  YES  TOURNIQUET:  * No tourniquets in log *  DICTATION: .Other Dictation: Dictation Number [id number]  PLAN OF CARE: Admit to inpatient   PATIENT DISPOSITION:  PACU - hemodynamically stable.   Delay start of Pharmacological VTE agent (>24hrs) due to surgical blood loss or risk of bleeding: no

## 2012-09-01 NOTE — Anesthesia Preprocedure Evaluation (Addendum)
Anesthesia Evaluation  Patient identified by MRN, date of birth, ID band Patient awake  General Assessment Comment:Had nausea for about 2 weeks after colectomy from unknown cause. Has a new sore throat this AM. Denies malaise, fevers at home, no SOB. Feels ready for surgery.  Reviewed: Allergy & Precautions, H&P , NPO status , Patient's Chart, lab work & pertinent test results  History of Anesthesia Complications (+) PONV  Airway Mallampati: II TM Distance: >3 FB Neck ROM: Full    Dental No notable dental hx.    Pulmonary neg pulmonary ROS,  breath sounds clear to auscultation  Pulmonary exam normal       Cardiovascular negative cardio ROS  + Valvular Problems/Murmurs Rhythm:Regular Rate:Normal  No heart murmur heard. H/O murmur as a child.    Neuro/Psych negative neurological ROS  negative psych ROS   GI/Hepatic Neg liver ROS, GERD-  ,  Endo/Other  Hypothyroidism   Renal/GU Renal diseaseChronic kidney disease. Cr 0.74 K 4.2  negative genitourinary   Musculoskeletal negative musculoskeletal ROS (+)   Abdominal (+) + obese,   Peds negative pediatric ROS (+)  Hematology negative hematology ROS (+)   Anesthesia Other Findings   Reproductive/Obstetrics negative OB ROS                         Anesthesia Physical Anesthesia Plan  ASA: II  Anesthesia Plan: General   Post-op Pain Management:    Induction: Intravenous  Airway Management Planned: Oral ETT  Additional Equipment:   Intra-op Plan:   Post-operative Plan: Extubation in OR  Informed Consent: I have reviewed the patients History and Physical, chart, labs and discussed the procedure including the risks, benefits and alternatives for the proposed anesthesia with the patient or authorized representative who has indicated his/her understanding and acceptance.   Dental advisory given  Plan Discussed with: CRNA  Anesthesia Plan  Comments: (Scopolamine patch on. Plan multiple antiemetics.)       Anesthesia Quick Evaluation

## 2012-09-01 NOTE — H&P (Signed)
[name] is an 56 y.o. female.   Chief Complaint: here for surgery HPI: 56 year old Caucasian female comes in for long-term followup. I last saw her in the office on June 5. Since she was seen last she states that she's been doing very well. She denies any fever, chills, nausea, vomiting, abdominal pain. She states that she has had some persistent irritation of her skin around her ostomy site. She has tried nystatin lotion, antifungal powder, as well as oral treatment for yeast infection without any changes or improvements. She reports an excellent appetite. She denies any medical procedures or diagnoses.  She states that she had a complete physical several weeks ago and there is nothing alarming on her physical other than a low vitamin D level.  The patient had undergone an emergent sigmoid colectomy with end colostomy on April 8 for perforated sigmoid diverticulitis. She had previously undergone a colonoscopy one to 2 years prior to her surgery.   Past Medical History  Diagnosis Date  . Diverticulitis of large intestine with perforation 02/07/2012  . PONV (postoperative nausea and vomiting)   . Heart murmur     as a child   . Hypothyroidism     hx of   . Chronic kidney disease     hx of uti   . GERD (gastroesophageal reflux disease)     Past Surgical History  Procedure Date  . Rotator cuff repair   . Tubal ligation   . Nose surgery   . Laparotomy 02/07/2012    Procedure: EXPLORATORY LAPAROTOMY;  Surgeon: Atilano Ina, MD,FACS;  Location: [facility] OR;  Service: General;  Laterality: Bilateral;  . Partial colectomy 02/07/2012    Procedure: PARTIAL COLECTOMY;  Surgeon: Atilano Ina, MD,FACS;  Location: [facility] OR;  Service: General;  Laterality: N/A;  . Colostomy 02/07/2012    Procedure: COLOSTOMY;  Surgeon: Atilano Ina, MD,FACS;  Location: [facility] OR;  Service: General;  Laterality: Left;  . Breast surgery     right benign breast lump removed     Family History  Problem Relation Age of Onset    . Diabetes Father   . Hypertension Father    Social History:  reports that she has never smoked. She has never used smokeless tobacco. She reports that she does not drink alcohol or use illicit drugs.  Allergies:  Allergies  Allergen Reactions  . Other Other (See Comments)    trinalin - for sinus infection - keeps me awake    Medications Prior to Admission  Medication Sig Dispense Refill  . Cholecalciferol (VITAMIN D3) 1000 UNIT/SPRAY LIQD Take 2 Units by mouth daily. Under tounge      . diphenhydrAMINE (BENADRYL) 25 MG tablet Take 25 mg by mouth as needed.      . hydrocortisone 2.5 % lotion Apply to affected area 2 times daily  60 mL  1  . Multiple Vitamins-Minerals (ALIVE WOMENS 50+) TABS Take 1 tablet by mouth daily. Gummy vitamin - 2 for adults        No results found for this or any previous visit (from the past 48 hour(s)). No results found.  Review of Systems  Constitutional: Negative for fever, chills and weight loss.  HENT:       Sore throat  Respiratory: Negative for shortness of breath.   Cardiovascular: Negative for chest pain, palpitations and orthopnea.  Gastrointestinal: Negative for nausea, vomiting and abdominal pain.  Neurological: Negative for dizziness, tingling, focal weakness, seizures and loss of consciousness.  All other systems reviewed and are negative.    Blood pressure 143/80, pulse 81, temperature 99 F (37.2 C), resp. rate 20, SpO2 98.00%. Physical Exam  Vitals reviewed. Constitutional: She is oriented to person, place, and time. She appears well-developed and well-nourished. No distress.  HENT:  Head: Normocephalic and atraumatic.  Right Ear: External ear normal.  Left Ear: External ear normal.  Eyes: Conjunctivae normal are normal.  Neck: Neck supple. No tracheal deviation present. No thyromegaly present.  Cardiovascular: Normal rate and regular rhythm.   Respiratory: Effort normal and breath sounds normal. No respiratory distress. She  has no wheezes.  GI: Soft. Bowel sounds are normal. She exhibits no distension. There is no tenderness.       Ostomy left mid abdomen  Musculoskeletal: She exhibits no edema.  Neurological: She is alert and oriented to person, place, and time.  Skin: Skin is warm and dry. No rash noted. She is not diaphoretic. No erythema.  Psychiatric: She has a normal mood and affect. Her behavior is normal. Judgment and thought content normal.     Assessment/Plan To or for lap assisted colectomy, colostomy takedown Pt recd subcu heparin, entereg, and did her rectal and oral bowel prep All questions asked and answered  Mary Sella. Andrey Campanile, MD, FACS General, Bariatric, & Minimally Invasive Surgery [facility], [place]   [facility] M 09/01/2012, 7:24 AM

## 2012-09-02 LAB — BASIC METABOLIC PANEL
BUN: 5 mg/dL — ABNORMAL LOW (ref 6–23)
CO2: 25 mEq/L (ref 19–32)
Chloride: 103 mEq/L (ref 96–112)
Creatinine, Ser: 0.61 mg/dL (ref 0.50–1.10)
Glucose, Bld: 157 mg/dL — ABNORMAL HIGH (ref 70–99)

## 2012-09-02 LAB — CBC
HCT: 38.1 % (ref 36.0–46.0)
Hemoglobin: 12.8 g/dL (ref 12.0–15.0)
MCH: 30 pg (ref 26.0–34.0)
MCV: 89.4 fL (ref 78.0–100.0)
Platelets: 258 10*3/uL (ref 150–400)
RBC: 4.26 MIL/uL (ref 3.87–5.11)
WBC: 10.9 10*3/uL — ABNORMAL HIGH (ref 4.0–10.5)

## 2012-09-02 NOTE — Progress Notes (Signed)
Patient ID: [name], female   DOB: [birth year], 56 y.o.   MRN: [id number] [facility] Progress Note:   1 Day Post-Op  Subjective: Mental status is clear.  No complaints. No flatus Objective: Vital signs in last 24 hours: Temp:  [96.8 F (36 C)-100 F (37.8 C)] 99 F (37.2 C) (11/02 1002) Pulse Rate:  [60-89] 77  (11/02 1002) Resp:  [14-24] 18  (11/02 1002) BP: (98-124)/(57-74) 102/64 mmHg (11/02 1002) SpO2:  [97 %-100 %] 98 % (11/02 1002) Weight:  [159 lb (72.122 kg)] 159 lb (72.122 kg) (11/01 1215)  Intake/Output from previous day: 11/01 0701 - 11/02 0700 In: 4181.7 [I.V.:4181.7] Out: 3925 [Urine:3725; Blood:200] Intake/Output this shift:    Physical Exam: Work of breathing is normal.  No pain.  No flatus  Lab Results:  Results for orders placed during the hospital encounter of 09/01/12 (from the past 48 hour(s))  CBC     Status: Abnormal   Collection Time   09/02/12  4:17 AM      Component Value Range Comment   WBC 10.9 (*) 4.0 - 10.5 K/uL    RBC 4.26  3.87 - 5.11 MIL/uL    Hemoglobin 12.8  12.0 - 15.0 g/dL    HCT 19.1  47.8 - 29.5 %    MCV 89.4  78.0 - 100.0 fL    MCH 30.0  26.0 - 34.0 pg    MCHC 33.6  30.0 - 36.0 g/dL    RDW 62.1  30.8 - 65.7 %    Platelets 258  150 - 400 K/uL   BASIC METABOLIC PANEL     Status: Abnormal   Collection Time   09/02/12  4:17 AM      Component Value Range Comment   Sodium 135  135 - 145 mEq/L    Potassium 4.1  3.5 - 5.1 mEq/L    Chloride 103  96 - 112 mEq/L    CO2 25  19 - 32 mEq/L    Glucose, Bld 157 (*) 70 - 99 mg/dL    BUN 5 (*) 6 - 23 mg/dL    Creatinine, Ser 8.46  0.50 - 1.10 mg/dL    Calcium 8.7  8.4 - 96.2 mg/dL    GFR calc non Af Amer >90  >90 mL/min    GFR calc Af Amer >90  >90 mL/min     Radiology/Results: No results found.  Anti-infectives: Anti-infectives     Start     Dose/Rate Route Frequency Ordered Stop   09/01/12 0535   cefOXitin (MEFOXIN) 2 g in dextrose 5 % 50 mL IVPB        2 g 100  mL/hr over 30 Minutes Intravenous 60 min pre-op 09/01/12 0535 09/01/12 0751          Assessment/Plan: Problem List: Patient Active Problem List  Diagnosis  . S/P Hartmann's procedure  . Dermatitis    Doing well so far.  No gas yet.   1 Day Post-Op    LOS: 1 day   Matt B. Daphine Deutscher, MD, [facility] [phone] beeper [phone]  09/02/2012 10:05 AM

## 2012-09-02 NOTE — Op Note (Signed)
NAMEMEIA, EMLEY              ACCOUNT NO.:  [id number]  MEDICAL RECORD NO.:  [id number]  LOCATION:  1526                         FACILITY:  [facility]  PHYSICIAN:  Mary Sella. Andrey Campanile, MD, FACSDATE OF BIRTH:  October 06, 1956  DATE OF PROCEDURE:  09/01/2012 DATE OF DISCHARGE:                              OPERATIVE REPORT   PREOPERATIVE DIAGNOSES: 1. History of perforated sigmoid diverticulitis. 2. Undesired colostomy.  POSTOPERATIVE DIAGNOSES: 1. History of perforated sigmoid diverticulitis. 2. Undesired colostomy.  PROCEDURES: 1. Laparoscopic-assisted colostomy take down. 2. Laparoscopic sigmoid colectomy. 3. Flexible sigmoidoscopy.  SURGEON:  Mary Sella. Andrey Campanile, MD, FACS  ASSISTANT SURGEON:  Sandria Bales. Ezzard Standing, M.D. FACS  ANESTHESIA:  General plus some local consisting of 0.25% Marcaine with epinephrine.  SPECIMEN: 1. Sigmoid colon stitch marks of proximal margin. 2. Anastomotic donuts.  ESTIMATED BLOOD LOSS:  200 mL.  INDICATIONS FOR PROCEDURE:  The patient is a very pleasant 56 year old female who I operated on in early April for perforated sigmoid diverticulitis.  She had a very redundant sigmoid colon. At that time we just did a partial limited resection and I gave her end-colostomy.  She recovered uneventfully and it has been approximately a little bit over 6 months since her surgery and she desired colostomy reversal.  Prior to surgery, she had a barium enema, which demonstrated a long Hartmann's pouch with remaining diverticular changes in her remaining sigmoid colon.  I recommended that we removed the remaining sigmoid colon in order to minimize her chance of recurrent diverticulitis.  We discussed the risks and benefits of surgery including, but not limited to bleeding, infection, injury to surrounding structures, anastomotic stricture, anastomotic leak, blood clot formation, incisional hernia, surgical site wound infection, prolonged ileus, anesthesia risks, pneumonia,  cardiac complications and possible death.  The patient elected to proceed to the operating room.  DESCRIPTION OF PROCEDURE:  Prior to coming to the operating room, the patient received 5000 units of subcutaneous heparin as well as oral dosage of Entereg.  She was taken to the operating room and placed supine on the operating room table.  General endotracheal anesthesia was established.  A Foley catheter was placed along with sequential compression device.  She was placed in the lithotomy position with appropriate padding and her arms were tucked at her sides.  I sutured the colostomy closed with figure-of-8 0-silk suture.  We then prepped the buttocks and perineum with Betadine.  The abdomen was prepped with ChloraPrep and Betadine was used over the ostomy.  She was then draped in the usual standard surgical fashion.  She received antibiotics prior to skin incision.  A surgical time-out was performed.  I went into her abdomen in the right upper quadrant with an Optiview.  A 0.5-cm incision was made in the right upper quadrant about 2 fingerbreadths below the subcostal margin.  Then using a 0-degree scope with a 5-mm trocar, I advanced the trocar with the camera through all layers of the abdominal cavity, entered the abdominal cavity and established pneumoperitoneum to the patient pressure of 15 mmHg.  The laparoscope was advanced and the abdominal cavity was surveilled.  There was no evidence of injury to surrounding structures.  There was some omental  adhesions to the anterior abdominal wall.  Otherwise, the abdomen was free of adhesions. I placed a 5-mm trocar in the right lateral midabdomen under direct visualization.  Then using Endo Shears with and without electrocautery as well as a blunt grasper, I took down the omentum from the anterior abdominal wall in the midline.  Once I had cleared the midline, I placed another 5-mm trocar in the right lower abdomen laterally under  direct visualization.  At this point, I was able to completely finish taking down the omentum adhered to the anterior abdominal wall.  Then, I started taking down the omentum toward the colostomy site.  Once it was completely freed, the only remaining item attached to the abdominal wall was the colostomy as it went through the abdominal wall.  At this point, we switched to open.  Circular incision was made around the old ostomy at the mucocutaneous junction with electrocautery.  I then mobilized the proximal in colon from the surrounding subcutaneous fat.  I was able to separate the colon from the fascia.  I was eventually able to dunk the proximal end of the colon back into the abdominal cavity.  At this point, we returned laparoscopically. Several 0-vicryl sutures were placed around the fascial opening at the ostomy site and a 12mm balloon tip trocar was placed.  We had previously identified the long Hartmann's pouch, it reached all the way into the abdominal cavity. There was probably about 6 inches of sigmoid colon remaining.  Because she still had some diverticular disease on her barium enema in the remaining sigmoid colon, I elected to take out the remaining sigmoid colon down to the pelvic inlet.  Using Harmonic scalpel, I started taking down the mesentery near the colon wall.  Prior to doing this, two additional trocars were placed, 5-mm in the upper midline incision and one 5-mm trocar in the left lower quadrant all under direct visualization.  We continued to take down the mesentery in a serial fashion using the Harmonic scalpel.  Once we reached the sacral promontory, I felt this was the end of the sigmoid colon and the beginning of the upper rectum.  The 5-mm trocar in the left lower quadrant was upsized to a 12-mm trocar.  Then using an Echelon 60-mm stapler with a blue load, I transected the distal sigmoid colon.  The sigmoid colon was completely freed and detached, it was  placed in the right abdomen.  At this point, we brought down the proximal colon down toward the pelvis to make sure it reached down to the pelvis and it did without any undue tension.  At this point, we brought the proximal colon up through the old stoma site.  The end of the proximal colon was cleared of some surrounding fat and mesentery using a hemostat and Harmonic scalpel.  We then transected approximately 2 cm of the proximal colon with the 15-blade.  At this point, we obtained the EEA sizers. Her colon lumen was somewhat narrowed.  It did accommodate a 25-mm dilator, but we did not feel like it would accommodate anything larger. A 2-0 Prolene suture was run as a whip stitch around the proximal colon opening.  An Ethicon 25-mm EEA stapler was opened.  The anvil was detached from the stapler and placed within the proximal colon.  The 2-0 Prolene was tied down thus securing the anvil into the proximal colon. The colon was still viable.  There was no signs of ischemia to the end of the  colon.  At this point, the proximal colon with the anvil was returned to the abdominal cavity.  The balloon Hasson trocar was placed back in the old ostomy site and pneumoperitoneum was reestablished.  At this point, Dr. Ezzard Standing went below and first did a rigid proctoscopy and I visualized him doing that laparoscopically.  He was able to get up to the end of the staple line, which was about 20-23 cm from the anal verge.  He removed the rigid proctoscope and then advanced the EEA stapler.  He was able to manipulate the stapler to the end of the staple line of the rectal stump.  He then advanced the spike just posterior to the staple line with the orange part of the spike coming through.  At this point, the proximal colon with the anvil in it, ensuring that the mesentery was not twisted, was attached to the spike on the EEA stapler.  They were mated and it was secured prior to him closing down the stapler  ensured that the proximal colon mesentery was not twisted, and there was nothing between the two ends of the colon.  He then closed the stapler and held compression and then subsequently fired the stapler.  He removed the EEA stapler and obtained two intact anastomotic donuts.  I then obtained a bowel clamp and placed it upstream from the anastomosis.  He then performed a flexible sigmoidoscopy and advanced the flexible sigmoid scope up to the anastomosis and insufflated.  Meanwhile, I had placed saline in the pelvis.  There was no air bubbles on the leak test.  The anastomosis was visualized on the flexible sigmoidoscopy screen and it was intact and widely patent, and again there was no bubbles visualized. The rectum was desufflated and the scope was removed.  I then irrigated the pelvis.  The Hasson trocar was removed.  We then removed the sigmoid colon specimen from the abdominal cavity and passed it off the field.  I then reapproximated and closed the old ostomy site with interrupted 0 Novafil in a transverse fashion.  Once we had closed the fascia, I returned laparoscopically to view the closure.  There was nothing within our closure.  There was no air leak.  I irrigated the abdomen again and removed the irrigation fluid.  Pneumoperitoneum was released.  The trocar sites were closed with skin staples.  The old ostomy site was packed with 4x4s and then a sterile bandage.  Sterile bandages were placed over the other trocar sites.  The patient was extubated and brought to the recovery room in stable condition.  There were no immediate complications.  The patient tolerated the procedure well.  All needle, instrument counts and sponge counts were correct x2.     Mary Sella. Andrey Campanile, MD, FACS     EMW/MEDQ  D:  09/01/2012  T:  09/02/2012  Job:  [id number]

## 2012-09-02 NOTE — Progress Notes (Signed)
Pt's temp 102.1.  Pt c/o sore throat pain and sinus congestion. Pt performed IS for 10 times; temp rechecked 99.9.  Obtained a manual thermometer and its reading was 101.0.  Dr. Daphine Deutscher notified.  Order received for warm salt water gargles prn for sore throat pain.  Pt gargled x 1

## 2012-09-03 LAB — CBC
HCT: 34.1 % — ABNORMAL LOW (ref 36.0–46.0)
Hemoglobin: 11.3 g/dL — ABNORMAL LOW (ref 12.0–15.0)
MCH: 30.1 pg (ref 26.0–34.0)
MCV: 90.9 fL (ref 78.0–100.0)
Platelets: 234 10*3/uL (ref 150–400)
RBC: 3.75 MIL/uL — ABNORMAL LOW (ref 3.87–5.11)
WBC: 9.2 10*3/uL (ref 4.0–10.5)

## 2012-09-03 LAB — BASIC METABOLIC PANEL
CO2: 27 mEq/L (ref 19–32)
Calcium: 8.4 mg/dL (ref 8.4–10.5)
Chloride: 104 mEq/L (ref 96–112)
Creatinine, Ser: 0.69 mg/dL (ref 0.50–1.10)
Glucose, Bld: 132 mg/dL — ABNORMAL HIGH (ref 70–99)

## 2012-09-03 NOTE — Progress Notes (Signed)
General Surgery Note  LOS: 2 days  POD -  2 Days Post-Op  Assessment/Plan: 1.  LAPAROSCOPIC COLOSTOMY TAKEDOWN, LAPAROSCOPIC SIGMOID COLECTOMY, FLEXIBLE SIGMOIDOSCOPY - E. Andrey Campanile - 09/01/2012  Has done well.  Still with ileus - continue NPO  1a.  I started dressing changes for the LUQ ostomy wound.  It is clean.  2.  Sore throat - better  Subjective:  Dong well.  She had a lot of trouble with nausea with the last surgery, but has done much better this time. Objective:   Filed Vitals:   09/03/12 0819  BP:   Pulse:   Temp:   Resp: 20     Intake/Output from previous day:  11/02 0701 - 11/03 0700 In: 2400 [I.V.:2400] Out: 2850 [Urine:2850]  Intake/Output this shift:  Total I/O In: 0  Out: 300 [Urine:300]   Physical Exam:   General: WN middle aged WF who is alert and oriented.    HEENT: Normal. Pupils equal. .   Lungs: Clear   Abdomen: Soft.  Still with quiet abdomen.   Wound: LUQ ostomy wound clean - to start dressing changes.   Lab Results:    [facility] 09/03/12 0538 09/02/12 0417  WBC 9.2 10.9*  HGB 11.3* 12.8  HCT 34.1* 38.1  PLT 234 258    BMET   Basename 09/03/12 0538 09/02/12 0417  NA 136 135  K 3.9 4.1  CL 104 103  CO2 27 25  GLUCOSE 132* 157*  BUN 3* 5*  CREATININE 0.69 0.61  CALCIUM 8.4 8.7    PT/INR  No results found for this basename: LABPROT:2,INR:2 in the last 72 hours  ABG  No results found for this basename: PHART:2,PCO2:2,PO2:2,HCO3:2 in the last 72 hours   Studies/Results:  No results found.   Anti-infectives:   Anti-infectives     Start     Dose/Rate Route Frequency Ordered Stop   09/01/12 0535   cefOXitin (MEFOXIN) 2 g in dextrose 5 % 50 mL IVPB        2 g 100 mL/hr over 30 Minutes Intravenous 60 min pre-op 09/01/12 0535 09/01/12 0751          Ovidio Kin, MD, FACS Pager: [phone],   [facility] Office: [phone] 09/03/2012

## 2012-09-04 ENCOUNTER — Encounter (HOSPITAL_COMMUNITY): Payer: Self-pay | Admitting: General Surgery

## 2012-09-04 NOTE — Progress Notes (Signed)
3 Days Post-Op  Subjective: Walking multiple times throughout day. Some occasional nausea. No emesis. +flatus. Pain ok - just sore when get up and during dressing changes  Objective: Vital signs in last 24 hours: Temp:  [98.4 F (36.9 C)-99.7 F (37.6 C)] 98.4 F (36.9 C) (11/04 0958) Pulse Rate:  [68-78] 74  (11/04 0958) Resp:  [17-25] 19  (11/04 0958) BP: (92-100)/(59-67) 100/67 mmHg (11/04 0958) SpO2:  [96 %-100 %] 98 % (11/04 0958) Last BM Date: 09/01/12  Intake/Output from previous day: 11/03 0701 - 11/04 0700 In: 2400 [I.V.:2400] Out: 3950 [Urine:3950] Intake/Output this shift: Total I/O In: 400 [I.V.:400] Out: 625 [Urine:625]  Alert, nad Reg Soft, nd, incision c/d/i; dressing c/d/i; min TTP No edema  Lab Results:   [facility] 09/03/12 0538 09/02/12 0417  WBC 9.2 10.9*  HGB 11.3* 12.8  HCT 34.1* 38.1  PLT 234 258   BMET  Basename 09/03/12 0538 09/02/12 0417  NA 136 135  K 3.9 4.1  CL 104 103  CO2 27 25  GLUCOSE 132* 157*  BUN 3* 5*  CREATININE 0.69 0.61  CALCIUM 8.4 8.7   PT/INR No results found for this basename: LABPROT:2,INR:2 in the last 72 hours ABG No results found for this basename: PHART:2,PCO2:2,PO2:2,HCO3:2 in the last 72 hours  Studies/Results: No results found.  Anti-infectives: Anti-infectives     Start     Dose/Rate Route Frequency Ordered Stop   09/01/12 0535   cefOXitin (MEFOXIN) 2 g in dextrose 5 % 50 mL IVPB        2 g 100 mL/hr over 30 Minutes Intravenous 60 min pre-op 09/01/12 0535 09/01/12 0751          Assessment/Plan: s/p Procedure(s) (LRB) with comments: LAPAROSCOPIC COLOSTOMY TAKEDOWN (N/A) - Lap Assisted Colostomy Reversal  LAPAROSCOPIC SIGMOID COLECTOMY () FLEXIBLE SIGMOIDOSCOPY ()  Looks good. Started on clears today Cont entereg Cont lovenox Cont pca If tolerates clears today - will transition to oral pain meds in am and adv diet  Mary Sella. Andrey Campanile, MD, FACS General, Bariatric, & Minimally Invasive  Surgery [facility], [place]   LOS: 3 days    Atilano Ina 09/04/2012

## 2012-09-05 LAB — BASIC METABOLIC PANEL
BUN: 4 mg/dL — ABNORMAL LOW (ref 6–23)
CO2: 27 mEq/L (ref 19–32)
Calcium: 9 mg/dL (ref 8.4–10.5)
Glucose, Bld: 95 mg/dL (ref 70–99)
Sodium: 136 mEq/L (ref 135–145)

## 2012-09-05 LAB — CBC WITH DIFFERENTIAL/PLATELET
Basophils Relative: 0 % (ref 0–1)
Eosinophils Absolute: 0.2 10*3/uL (ref 0.0–0.7)
Eosinophils Relative: 2 % (ref 0–5)
Hemoglobin: 12.4 g/dL (ref 12.0–15.0)
Lymphs Abs: 2.1 10*3/uL (ref 0.7–4.0)
MCH: 29.9 pg (ref 26.0–34.0)
MCHC: 33.1 g/dL (ref 30.0–36.0)
MCV: 90.4 fL (ref 78.0–100.0)
Monocytes Relative: 8 % (ref 3–12)
RBC: 4.15 MIL/uL (ref 3.87–5.11)

## 2012-09-05 MED ORDER — OXYCODONE-ACETAMINOPHEN 5-325 MG PO TABS
1.0000 | ORAL_TABLET | ORAL | Status: DC | PRN
  Administered 2012-09-05 – 2012-09-07 (×10): 2 via ORAL
  Filled 2012-09-05 (×10): qty 2

## 2012-09-05 NOTE — Progress Notes (Signed)
4 Days Post-Op  Subjective: Doing well. Walked 16 laps yesterday. +flatus. No BM. Tolerated clears. Pain ok; some intermittent hypoTN overnight.  Objective: Vital signs in last 24 hours: Temp:  [98.4 F (36.9 C)-100.1 F (37.8 C)] 99.3 F (37.4 C) (11/05 0531) Pulse Rate:  [61-81] 61  (11/05 0531) Resp:  [16-23] 16  (11/05 0531) BP: (77-100)/(50-70) 90/50 mmHg (11/05 0531) SpO2:  [96 %-100 %] 99 % (11/05 0531) Last BM Date: 09/01/12  Intake/Output from previous day: 11/04 0701 - 11/05 0700 In: 2880 [P.O.:480; I.V.:2400] Out: 3625 [Urine:3625] Intake/Output this shift:    Alert, nad cta Reg Soft, nd, min TTP, +BS, dressing c/d/i No edema  Lab Results:   [facility] 09/03/12 0538  WBC 9.2  HGB 11.3*  HCT 34.1*  PLT 234   BMET  Basename 09/03/12 0538  NA 136  K 3.9  CL 104  CO2 27  GLUCOSE 132*  BUN 3*  CREATININE 0.69  CALCIUM 8.4   PT/INR No results found for this basename: LABPROT:2,INR:2 in the last 72 hours ABG No results found for this basename: PHART:2,PCO2:2,PO2:2,HCO3:2 in the last 72 hours  Studies/Results: No results found.  Anti-infectives: Anti-infectives     Start     Dose/Rate Route Frequency Ordered Stop   09/01/12 0535   cefOXitin (MEFOXIN) 2 g in dextrose 5 % 50 mL IVPB        2 g 100 mL/hr over 30 Minutes Intravenous 60 min pre-op 09/01/12 0535 09/01/12 0751          Assessment/Plan: s/p Procedure(s) (LRB) with comments: LAPAROSCOPIC COLOSTOMY TAKEDOWN (N/A) - Lap Assisted Colostomy Reversal  LAPAROSCOPIC SIGMOID COLECTOMY () FLEXIBLE SIGMOIDOSCOPY ()  Adv diet to fulls Decrease ivf Check cbc, bmet D/c pca, po pain meds  [name]. Andrey Campanile, MD, FACS General, Bariatric, & Minimally Invasive Surgery [facility], [place]   LOS: 4 days    [name] 09/05/2012

## 2012-09-06 NOTE — Progress Notes (Signed)
5 Days Post-Op  Subjective: Doing well. Walked multiple times yesterday. +flatus. No emesis. Pain ok  Objective: Vital signs in last 24 hours: Temp:  [97.4 F (36.3 C)-98.8 F (37.1 C)] 97.4 F (36.3 C) (11/06 0553) Pulse Rate:  [57-73] 57  (11/06 0553) Resp:  [16-20] 18  (11/06 0553) BP: (88-105)/(50-70) 90/70 mmHg (11/06 0553) SpO2:  [97 %-100 %] 100 % (11/06 0553) Last BM Date: 09/01/12  Intake/Output from previous day: 11/05 0701 - 11/06 0700 In: 1511.7 [P.O.:840; I.V.:671.7] Out: 4600 [Urine:4600] Intake/Output this shift:    Alert, nad, smiling cta ant Reg Soft, nd, incisions c/d/i - old ostomy site- no cellulitis No edema  Lab Results:   Basename 09/05/12 0935  WBC 7.7  HGB 12.4  HCT 37.5  PLT 267   BMET  Basename 09/05/12 0935  NA 136  K 3.7  CL 100  CO2 27  GLUCOSE 95  BUN 4*  CREATININE 0.59  CALCIUM 9.0   PT/INR No results found for this basename: LABPROT:2,INR:2 in the last 72 hours ABG No results found for this basename: PHART:2,PCO2:2,PO2:2,HCO3:2 in the last 72 hours  Studies/Results: No results found.  Anti-infectives: Anti-infectives     Start     Dose/Rate Route Frequency Ordered Stop   09/01/12 0535   cefOXitin (MEFOXIN) 2 g in dextrose 5 % 50 mL IVPB        2 g 100 mL/hr over 30 Minutes Intravenous 60 min pre-op 09/01/12 0535 09/01/12 0751          Assessment/Plan: s/p Procedure(s) (LRB) with comments: LAPAROSCOPIC COLOSTOMY TAKEDOWN (N/A) - Lap Assisted Colostomy Reversal  LAPAROSCOPIC SIGMOID COLECTOMY () FLEXIBLE SIGMOIDOSCOPY ()  Adv diet to regular  Cont w-d dressing  Home Thursday with [facility] for wound care  Mary Sella. Andrey Campanile, MD, FACS General, Bariatric, & Minimally Invasive Surgery [facility], [place]   LOS: 5 days    Atilano Ina 09/06/2012

## 2012-09-06 NOTE — Progress Notes (Signed)
Pharmacy Brief Note - Alvimopan (Entereg)  The standing order set for alvimopan (Entereg) now includes an automatic order to discontinue the drug after the patient has had a bowel movement.  The change was approved by the Pharmacy & Therapeutics Committee and the Medical Executive Committee.    This patient has had a bowel movement documented by nursing.  Therefore, alvimopan has been discontinued.  If there are questions, please contact the pharmacy at [phone].  Thank you  Geoffry Paradise, PharmD.   Pager:  [phone] 1:18 PM

## 2012-09-07 MED ORDER — OXYCODONE-ACETAMINOPHEN 7.5-325 MG PO TABS: 1.0000 | ORAL_TABLET | ORAL | Status: DC | PRN

## 2012-09-07 NOTE — Discharge Summary (Signed)
Physician Discharge Summary  [name] MWN:[id number] DOB: 08-Feb-1956 DOA: 09/01/2012  PCP: Leo Grosser, MD  Admit date: 09/01/2012 Discharge date: 09/07/2012  Recommendations for Outpatient Follow-up:  1. HomeHealth nursing for daily dressing changes to old ostomy site  Follow-up Information    Follow up with [facility]. (RN for wound care)    Contact information:   [address] [place] [address] [phone]      Follow up with Atilano Ina, MD,FACS. On 09/20/2012. (11:45 AM)    Contact information:   [address] [place] [address] [phone]         Discharge Diagnoses:  1. Undesired end colostomy 2. H/o Hartmann's procedure 3. S/p Laparoscopic colostomy reversal with colectomy  Surgical Procedure: LAPAROSCOPIC COLOSTOMY REVERSAL, LAPAROSCOPIC SIGMOID COLECTOMY, FLEXIBLE SIGMOIDOSCOPY  Discharge Condition: GOOD Disposition: to home with [facility]  Diet recommendation: regular  Filed Weights   09/01/12 1215  Weight: 159 lb (72.122 kg)    Hospital Course:  56 year old Caucasian female was admitted for a planned colostomy reversal. She underwent the procedure on November 1. She was maintained on perioperative Entereg as well as perioperative chemical DVT prophylaxis. She was given ice chips starting after surgery. Her Foley was discontinued on postoperative day 2. She was started on clear liquids around postoperative day 3. Her diet was gradually advanced as her bowel function returned. She was getting daily dressing changes to her old ostomy site which was left open. On day of discharge, she was tolerating a regular diet, she was having bowel movements, she was ambulating without assistance, her vital signs are stable, and her wounds were stable. There is no signs of wound infection.  Physical exam revealed that she was in no apparent distress, her lungs are clear, her is regular, her abdomen was soft, nondistended.  Her trocar sites were clean dry and intact. Her old ostomy site had no surrounding cellulitis. She was deemed stable for discharge   Discharge Instructions  Discharge Orders    Future Appointments: Provider: Department: Dept Phone: Center:   09/20/2012 11:45 AM Atilano Ina, MD,FACS [facility], [place] [phone] None     Future Orders Please Complete By Expires   Diet - low sodium heart healthy      Increase activity slowly      Discharge instructions      Comments:   See [facility] discharge instructions       Medication List     As of 09/07/2012  9:06 AM    TAKE these medications         ALIVE WOMENS 50+ Tabs   Take 1 tablet by mouth daily. Gummy vitamin - 2 for adults      diphenhydrAMINE 25 MG tablet   Commonly known as: BENADRYL   Take 25 mg by mouth as needed.      hydrocortisone 2.5 % lotion   Apply to affected area 2 times daily      oxyCODONE-acetaminophen 7.5-325 MG per tablet   Commonly known as: PERCOCET   Take 1-2 tablets by mouth every 4 (four) hours as needed for pain.      Vitamin D3 1000 UNIT/SPRAY Liqd   Take 2 Units by mouth daily. Under tounge           Follow-up Information    Follow up with [facility]. Banker for wound care)    Contact information:   [address] [place] [address] [phone]  Follow up with Atilano Ina, MD,FACS. On 09/20/2012. (11:45 AM)    Contact information:   [address] [place] [address] [phone]           The results of significant diagnostics from this hospitalization (including imaging, microbiology, ancillary and laboratory) are listed below for reference.    Significant Diagnostic Studies: No results found.  Microbiology: Recent Results (from the past 240 hour(s))  SURGICAL PCR SCREEN     Status: Normal   Collection Time   08/28/12 12:49 PM      Component Value Range Status Comment   MRSA, PCR NEGATIVE  NEGATIVE Final    Staphylococcus  aureus NEGATIVE  NEGATIVE Final      Labs: Basic Metabolic Panel:  Lab 09/05/12 7846 09/03/12 0538 09/02/12 0417  NA 136 136 135  K 3.7 3.9 4.1  CL 100 104 103  CO2 27 27 25   GLUCOSE 95 132* 157*  BUN 4* 3* 5*  CREATININE 0.59 0.69 0.61  CALCIUM 9.0 8.4 8.7  MG -- -- --  PHOS -- -- --     Lab 09/05/12 0935 09/03/12 0538 09/02/12 0417  WBC 7.7 9.2 10.9*  NEUTROABS 4.9 -- --  HGB 12.4 11.3* 12.8  HCT 37.5 34.1* 38.1  MCV 90.4 90.9 89.4  PLT 267 234 258     Time coordinating discharge: 15 minutes Signed:  Atilano Ina, MD [facility], [place] [phone] 09/07/2012, 9:06 AM

## 2012-09-20 ENCOUNTER — Ambulatory Visit (INDEPENDENT_AMBULATORY_CARE_PROVIDER_SITE_OTHER): Payer: BC Managed Care – PPO | Admitting: General Surgery

## 2012-09-20 ENCOUNTER — Encounter (INDEPENDENT_AMBULATORY_CARE_PROVIDER_SITE_OTHER): Payer: Self-pay | Admitting: General Surgery

## 2012-09-20 VITALS — BP 114/66 | HR 72 | Temp 97.8°F | Resp 18 | Ht 61.0 in | Wt 152.0 lb

## 2012-09-20 DIAGNOSIS — Z09 Encounter for follow-up examination after completed treatment for conditions other than malignant neoplasm: Secondary | ICD-10-CM

## 2012-09-20 NOTE — Progress Notes (Signed)
Subjective:     Patient ID: [name], female   DOB: [birth year], 56 y.o.   MRN: [id number]  HPI 56 year old Caucasian female comes in for followup after undergoing laparoscopic assisted colostomy reversal and completion sigmoid colectomy on November 1. She was discharged from the hospital on November 7. She has been doing wet to dry dressing changes to her old ostomy site. She states that she has been doing well. She denies any fever, chills, nausea, vomiting, diarrhea or constipation. She reports good appetite. She states her energy level is slowly improving. She states that she has mainly had nausea associated with taking the Percocet. She states that she still has some intermittent discomfort on her left side but it has gotten better. She will take a percocet at night to help her sleep  Review of Systems     Objective:   Physical Exam BP 114/66  Pulse 72  Temp 97.8 F (36.6 C) (Temporal)  Resp 18  Ht 5\' 1"  (1.549 m)  Wt 152 lb (68.947 kg)  BMI 28.72 kg/m2  Gen: alert, NAD, non-toxic appearing Pupils: equal, no scleral icterus Pulm: Lungs clear to auscultation, symmetric chest rise CV: regular rate and rhythm Abd: soft, nontender, nondistended. Well-healed trocar sites. No cellulitis. No incisional hernia. Old stoma site - no cellulitis. Wound measures 2cm wide x 1.5cm vertical x 1.5cm deep. Great beefy red granulation tissue Ext: no edema, no calf tenderness Skin: no rash, no jaundice     Assessment:     Status post laparoscopic assisted colostomy reversal with completion sigmoid colectomy    Plan:     I believe she is doing quite well. Her wound looks excellent. We removed her trocar sites staples today. I advised her that she still should not do strenuous activity for another several weeks. I encouraged her to eat a well-balanced healthy diet. Followup 8 weeks  Mary Sella. Andrey Campanile, MD, FACS General, Bariatric, & Minimally Invasive Surgery [facility], [place]

## 2012-09-20 NOTE — Patient Instructions (Signed)
Continue current wound care. When gets to shallow to pack, can just cover with a dry gauze

## 2012-09-21 ENCOUNTER — Telehealth (INDEPENDENT_AMBULATORY_CARE_PROVIDER_SITE_OTHER): Payer: Self-pay | Admitting: General Surgery

## 2012-09-21 NOTE — Telephone Encounter (Signed)
Spoke with pt and informed her that her apt will be on 11/09/12 at 11:45.

## 2012-09-26 ENCOUNTER — Encounter (INDEPENDENT_AMBULATORY_CARE_PROVIDER_SITE_OTHER): Payer: Self-pay | Admitting: Surgery

## 2012-09-26 ENCOUNTER — Ambulatory Visit (INDEPENDENT_AMBULATORY_CARE_PROVIDER_SITE_OTHER): Payer: BC Managed Care – PPO | Admitting: Surgery

## 2012-09-26 ENCOUNTER — Telehealth (INDEPENDENT_AMBULATORY_CARE_PROVIDER_SITE_OTHER): Payer: Self-pay

## 2012-09-26 VITALS — BP 130/74 | HR 68 | Temp 97.0°F | Resp 18 | Ht 63.0 in | Wt 151.2 lb

## 2012-09-26 DIAGNOSIS — Z9889 Other specified postprocedural states: Secondary | ICD-10-CM

## 2012-09-26 LAB — COMPREHENSIVE METABOLIC PANEL
AST: 22 U/L (ref 0–37)
Albumin: 4.5 g/dL (ref 3.5–5.2)
Alkaline Phosphatase: 106 U/L (ref 39–117)
BUN: 9 mg/dL (ref 6–23)
Calcium: 10.2 mg/dL (ref 8.4–10.5)
Chloride: 105 mEq/L (ref 96–112)
Creat: 0.7 mg/dL (ref 0.50–1.10)
Glucose, Bld: 100 mg/dL — ABNORMAL HIGH (ref 70–99)

## 2012-09-26 LAB — CBC WITH DIFFERENTIAL/PLATELET
Basophils Absolute: 0 10*3/uL (ref 0.0–0.1)
Basophils Relative: 0 % (ref 0–1)
Eosinophils Relative: 1 % (ref 0–5)
HCT: 44.2 % (ref 36.0–46.0)
Hemoglobin: 15 g/dL (ref 12.0–15.0)
MCHC: 33.9 g/dL (ref 30.0–36.0)
MCV: 89.3 fL (ref 78.0–100.0)
Monocytes Absolute: 0.6 10*3/uL (ref 0.1–1.0)
Monocytes Relative: 6 % (ref 3–12)
RDW: 13.8 % (ref 11.5–15.5)

## 2012-09-26 NOTE — Telephone Encounter (Addendum)
Patient calling to report since Sunday evening she has had lower back pain that radiates around to her abdomen.  Patient reports having mild relief when applying a heating pad to her lower back.  Patient reports an increase in bloating and nausea as well.  Patient taking metamucil and reports having frequent bowel movements.  (s/p Colostomy Takedown 09/01/12) Paged Dr. Andrey Campanile-- ordered CBC w/Diff and CMET STAT for urgent office today.  Called and notified patient, she will go around 12:30 pm for lab work.

## 2012-09-26 NOTE — Patient Instructions (Signed)
Liquid diet for now.  Will set up CT abdomen.

## 2012-09-26 NOTE — Progress Notes (Signed)
Patient presents to to abdominal pain. She is a patient Dr. Andrey Campanile underwent colostomy closure on November 1. She's doing well until the weekend she began developed pain around the old colostomy site is now radiates toward her right lower quadrant and left upper quadrant. She also had some back pain with this. The pain is worse at night is compared during the day. Her bowels are moving. She is not vomiting but has not been eating much. She is having bowel movements and passing gas. No fever or chills.  Exam: Abdomen nondistended. No  Mild vague tenderness to palpation left lower quadrant around the old ostomy site. No evidence of hernia or wound breakdown. Open wound and old ostomy site is clean and contracting. No significant guarding.  Impression: Postoperative abdominal pain after colostomy closure she does not appear to be septic or have  Peritonitis  Plan: CT scan abdomen pelvis. Her CBC today shows a normal white count. The remainder of her labs are pending.

## 2012-09-27 ENCOUNTER — Ambulatory Visit
Admission: RE | Admit: 2012-09-27 | Discharge: 2012-09-27 | Disposition: A | Discharge status: Home / Self Care | Payer: BC Managed Care – PPO | Source: Ambulatory Visit | Attending: Surgery | Admitting: Surgery

## 2012-09-27 ENCOUNTER — Telehealth (INDEPENDENT_AMBULATORY_CARE_PROVIDER_SITE_OTHER): Payer: Self-pay | Admitting: General Surgery

## 2012-09-27 DIAGNOSIS — Z9889 Other specified postprocedural states: Secondary | ICD-10-CM

## 2012-09-27 MED ORDER — IOHEXOL 300 MG/ML  SOLN
100.0000 mL | Freq: Once | INTRAMUSCULAR | Status: AC | PRN
  Administered 2012-09-27: 100 mL via INTRAVENOUS

## 2012-09-27 NOTE — Telephone Encounter (Signed)
Patient aware CT okay per Dr Andrey Campanile, just shows post surgical changes. She states she does feel better. No pain on right side today and pain is better on left side and in back. I advised Ibuprofen and heating pads. She will call with any increase in symptoms and keep follow up appt.

## 2012-11-09 ENCOUNTER — Encounter (INDEPENDENT_AMBULATORY_CARE_PROVIDER_SITE_OTHER): Payer: Self-pay | Admitting: General Surgery

## 2012-11-09 ENCOUNTER — Ambulatory Visit (INDEPENDENT_AMBULATORY_CARE_PROVIDER_SITE_OTHER): Payer: BC Managed Care – PPO | Admitting: General Surgery

## 2012-11-09 VITALS — BP 110/70 | HR 80 | Resp 18 | Ht 61.0 in | Wt 154.0 lb

## 2012-11-09 DIAGNOSIS — Z09 Encounter for follow-up examination after completed treatment for conditions other than malignant neoplasm: Secondary | ICD-10-CM

## 2012-11-09 NOTE — Patient Instructions (Signed)
Can resume full activities 

## 2012-11-09 NOTE — Progress Notes (Signed)
Subjective:     Patient ID: [name], female   DOB: April 26, 1956, 57 y.o.   MRN: [id number]  HPI 57 year old Caucasian female comes in for followup after undergoing a laparoscopic colostomy reversal and completion sigmoid colectomy on November 59. She was last seen on November 26. She denies any fever, chills, nausea, vomiting, diarrhea or constipation. Her old ostomy site has completely healed. She still has some occasional discomfort on her left side but it is not near as frequent as it was in November. It only causes some mild discomfort and it is very seldom now. She reports a good appetite. She denies any melena or hematochezia  Review of Systems     Objective:   Physical Exam BP 110/70  Pulse 80  Resp 18  Ht 5\' 1"  (1.549 m)  Wt 154 lb (69.854 kg)  BMI 29.10 kg/m2 Alert, nad Pleasant Soft, nontender. Well healed Left sided ostomy site. No hernia. No cellulitis.     Assessment:     Status post laparoscopic colostomy reversal and completion sigmoid colectomy for a history of perforated diverticulitis 09/01/2012    Plan:     Her incision has completely healed. She appears to doing great. I have released her to full activities. I explained that she may have some continued intermittent discomfort on the left side for several months. I encouraged her to contact her gastroenterologist to discuss the timing of her next screening colonoscopy. followup with me as needed  Mary Sella. Andrey Campanile, MD, FACS General, Bariatric, & Minimally Invasive Surgery [facility], [place]

## 2013-03-23 ENCOUNTER — Encounter: Payer: Self-pay | Admitting: Physician Assistant

## 2013-03-23 ENCOUNTER — Ambulatory Visit (INDEPENDENT_AMBULATORY_CARE_PROVIDER_SITE_OTHER): Payer: BC Managed Care – PPO | Admitting: Physician Assistant

## 2013-03-23 VITALS — BP 100/70 | HR 72 | Temp 97.4°F | Resp 18 | Ht 60.0 in | Wt 158.0 lb

## 2013-03-23 DIAGNOSIS — L255 Unspecified contact dermatitis due to plants, except food: Secondary | ICD-10-CM

## 2013-03-23 DIAGNOSIS — H8303 Labyrinthitis, bilateral: Secondary | ICD-10-CM

## 2013-03-23 DIAGNOSIS — H8309 Labyrinthitis, unspecified ear: Secondary | ICD-10-CM

## 2013-03-23 DIAGNOSIS — J069 Acute upper respiratory infection, unspecified: Secondary | ICD-10-CM

## 2013-03-23 MED ORDER — MECLIZINE HCL 25 MG PO TABS: 25.0000 mg | ORAL_TABLET | Freq: Three times a day (TID) | ORAL | Status: DC | PRN

## 2013-03-23 MED ORDER — PREDNISONE 20 MG PO TABS: ORAL_TABLET | ORAL | Status: DC

## 2013-03-23 NOTE — Progress Notes (Signed)
Patient ID: ALEGRA ROST MRN: [id number], DOB: 1956/06/01, 57 y.o. Date of Encounter: @DATE @  Chief Complaint:  Chief Complaint  Patient presents with  . Dizziness    X week  . Nasal Congestion    X yesterdfay    HPI: 57 y.o. year old female  Reports that symptoms began 6 days ago. That morning she felst some pain in right ear. This resolved later that day. The following day she felt staggery and unsteady balnce. Never had any spinning sensation. Has continued to feel this staggery feeling. Yesterday developed nasal congestion. Gets no drainage out of nose but feels it running down throat. Anything she has been able to get out has been watery clear. No thick mucus.    Also, breaking out with poison oak/ poison ivey. Noticed first spot 3 days ago-was on right hand. Since then has developed more spots on right wrist/foreearm, and now an right neck. Using Benaryl with no improvemnet.   Past Medical History  Diagnosis Date  . Diverticulitis of large intestine with perforation 02/07/2012  . PONV (postoperative nausea and vomiting)   . Heart murmur     as a child   . Hypothyroidism     hx of   . Chronic kidney disease     hx of uti   . GERD (gastroesophageal reflux disease)      Home Meds: See attached medication section for current medication list. Any medications entered into computer today will not appear on this note's list. The medications listed below were entered prior to today. Current Outpatient Prescriptions on File Prior to Visit  Medication Sig Dispense Refill  . Cholecalciferol (VITAMIN D3) 1000 UNIT/SPRAY LIQD Take 2 Units by mouth daily. Under tounge      . diphenhydrAMINE (BENADRYL) 25 MG tablet Take 25 mg by mouth as needed.      . Multiple Vitamins-Minerals (ALIVE WOMENS 50+) TABS Take 1 tablet by mouth daily. Gummy vitamin - 2 for adults       No current facility-administered medications on file prior to visit.    Allergies:  Allergies  Allergen Reactions   . Other Other (See Comments)    trinalin - for sinus infection - keeps me awake    History   Social History  . Marital Status: Married    Spouse Name: N/A    Number of Children: N/A  . Years of Education: N/A   Occupational History  . Not on file.   Social History Main Topics  . Smoking status: Never Smoker   . Smokeless tobacco: Never Used  . Alcohol Use: No  . Drug Use: No  . Sexually Active: Not on file   Other Topics Concern  . Not on file   Social History Narrative  . No narrative on file    Family History  Problem Relation Age of Onset  . Diabetes Father   . Hypertension Father      Review of Systems:  See HPI for pertinent ROS. All other ROS negative.    Physical Exam: Blood pressure 100/70, pulse 72, temperature 97.4 F (36.3 C), temperature source Oral, resp. rate 18, height 5' (1.524 m), weight 158 lb (71.668 kg)., Body mass index is 30.86 kg/(m^2). General: WNWD WF. Appears in no acute distress. Head: Normocephalic, atraumatic, eyes without discharge, sclera non-icteric, nares are without discharge. Bilateral auditory canals clear, TM's are without perforation, pearly grey and translucent with reflective cone of light bilaterally. Oral cavity moist, posterior pharynx without exudate, erythema,  peritonsillar abscess. No sinus tenderness with percussion.  [place] Maneuver: Positive.   Neck: Supple. No thyromegaly. No lymphadenopathy. Lungs: Clear bilaterally to auscultation without wheezes, rales, or rhonchi. Breathing is unlabored. Heart: RRR with S1 S2. No murmurs, rubs, or gallops. Musculoskeletal:  Strength and tone normal for age. Extremities/Skin: Warm and dry. Red papules on right hand, right wrist, right forearm, right neck.   Neuro: Alert and oriented X 3. Moves all extremities spontaneously. Gait is normal. CNII-XII grossly in tact. Romberg: Negative.  Psych:  Responds to questions appropriately with a normal affect.    ASSESSMENT AND  PLAN:  57 y.o. year old female with  1. Viral labyrinthitis, bilateral - meclizine (ANTIVERT) 25 MG tablet; Take 1 tablet (25 mg total) by mouth 3 (three) times daily as needed.  Dispense: 30 tablet; Refill: 0 Should resolve in 1 week, then f/u. Or if develops new/different symtoms, then f/u. 2. Viral upper respiratory infection Symptomatic treatment. Should resolve in 1 week. If does not, then f/u.  3. Allergic dermatitis due to poison vine Continue Benadryl. Add Prednison. - predniSONE (DELTASONE) 20 MG tablet; Take 3 daily for 2 days, then 2 daily for 2 days, then 1 daily for 2 days.  Dispense: 12 tablet; Refill: 0  If any of these conditions worsens or does not resolve in one week, then f/u. Signed, [address] [place], [place], [facility] 03/23/2013 9:04 AM

## 2013-05-08 ENCOUNTER — Telehealth: Payer: Self-pay | Admitting: Family Medicine

## 2013-05-08 DIAGNOSIS — N644 Mastodynia: Secondary | ICD-10-CM

## 2013-05-08 NOTE — Telephone Encounter (Signed)
Pt left me mess on answering machine here at office.  I called her back.  She is due for her yearly mammogram.  States has recently had extensive abdominal surgery and is behind on getting mammogram done.  Also states is having a pulling sensation in right breast and has a history of right breast surgery as teenager.  Told by [facility] needs order for diagnostic mammogram w/ ultrasound as needed.  Order placed for patient

## 2013-05-08 NOTE — Telephone Encounter (Signed)
Thank you.Approved/agree.

## 2013-05-21 ENCOUNTER — Ambulatory Visit
Admission: RE | Admit: 2013-05-21 | Discharge: 2013-05-21 | Disposition: A | Discharge status: Home / Self Care | Payer: BC Managed Care – PPO | Source: Ambulatory Visit | Attending: Physician Assistant | Admitting: Physician Assistant

## 2013-05-21 DIAGNOSIS — N644 Mastodynia: Secondary | ICD-10-CM

## 2013-06-19 ENCOUNTER — Other Ambulatory Visit: Payer: Self-pay | Admitting: Physician Assistant

## 2013-06-19 ENCOUNTER — Telehealth: Payer: Self-pay | Admitting: Family Medicine

## 2013-06-19 NOTE — Telephone Encounter (Signed)
Medication refilled per protocol. 

## 2013-06-19 NOTE — Telephone Encounter (Signed)
This was done 06/19/13

## 2013-08-15 ENCOUNTER — Other Ambulatory Visit: Payer: Self-pay | Admitting: Gastroenterology

## 2013-09-04 ENCOUNTER — Encounter (INDEPENDENT_AMBULATORY_CARE_PROVIDER_SITE_OTHER): Payer: Self-pay

## 2013-09-05 ENCOUNTER — Encounter (INDEPENDENT_AMBULATORY_CARE_PROVIDER_SITE_OTHER): Payer: Self-pay

## 2013-12-19 ENCOUNTER — Ambulatory Visit (INDEPENDENT_AMBULATORY_CARE_PROVIDER_SITE_OTHER): Payer: BC Managed Care – PPO | Admitting: Physician Assistant

## 2013-12-19 ENCOUNTER — Encounter: Payer: Self-pay | Admitting: Physician Assistant

## 2013-12-19 VITALS — BP 118/76 | HR 68 | Temp 97.8°F | Resp 18 | Ht 60.25 in | Wt 168.0 lb

## 2013-12-19 DIAGNOSIS — R3915 Urgency of urination: Secondary | ICD-10-CM

## 2013-12-19 DIAGNOSIS — M545 Low back pain, unspecified: Secondary | ICD-10-CM

## 2013-12-19 LAB — URINALYSIS, ROUTINE W REFLEX MICROSCOPIC
BILIRUBIN URINE: NEGATIVE
GLUCOSE, UA: NEGATIVE mg/dL
HGB URINE DIPSTICK: NEGATIVE
Ketones, ur: NEGATIVE mg/dL
LEUKOCYTES UA: NEGATIVE
Nitrite: NEGATIVE
PROTEIN: NEGATIVE mg/dL
Specific Gravity, Urine: 1.015 (ref 1.005–1.030)
Urobilinogen, UA: 0.2 mg/dL (ref 0.0–1.0)
pH: 5.5 (ref 5.0–8.0)

## 2013-12-19 NOTE — Progress Notes (Signed)
Patient ID: TANEE HENERY MRN: [id number], DOB: 1956/02/09, 58 y.o. Date of Encounter: 12/19/2013, 3:09 PM    Chief Complaint:  Chief Complaint  Patient presents with  . c/o poss UTI vs back strain     HPI: 58 y.o. year old female reports that she has been having some achy discomfort in her low back. Says it is worse on the right side but she also has some discomfort on the left. Says it is worse early in the morning and also sometimes she feels it if she lays down during the day. Says that 3 weeks ago she picked up a large case of bottled waters. After doing that, the next 3 days she had quite a bit of low back pain. Then it got better but then she's had some discomfort there off and on ever since. Yesterday her urine looked cloudy so she felt that she better come in and make sure she did not have a UTI or 'kidney infection" causing her back pain. Has had no fevers or chills. Has been taking Aleve which relieves her back pain.     Home Meds: See attached medication section for any medications that were entered at today's visit. The computer does not put those onto this list.The following list is a list of meds entered prior to today's visit.   Current Outpatient Prescriptions on File Prior to Visit  Medication Sig Dispense Refill  . diphenhydrAMINE (BENADRYL) 25 MG tablet Take 25 mg by mouth as needed.      . Multiple Vitamins-Minerals (ALIVE WOMENS 50+) TABS Take 1 tablet by mouth daily. Gummy vitamin - 2 for adults       No current facility-administered medications on file prior to visit.    Allergies:  Allergies  Allergen Reactions  . Other Other (See Comments)    trinalin - for sinus infection - keeps me awake      Review of Systems: See HPI for pertinent ROS. All other ROS negative.    Physical Exam: Blood pressure 118/76, pulse 68, temperature 97.8 F (36.6 C), temperature source Oral, resp. rate 18, height 5' 0.25" (1.53 m), weight 168 lb (76.204 kg)., Body  mass index is 32.55 kg/(m^2). General:  Obese WF. Appears in no acute distress. Neck: Supple. No thyromegaly. No lymphadenopathy. Lungs: Clear bilaterally to auscultation without wheezes, rales, or rhonchi. Breathing is unlabored. Heart: Regular rhythm. No murmurs, rubs, or gallops. Abdomen: Soft, non-tender, non-distended with normoactive bowel sounds. No hepatomegaly. No rebound/guarding. No obvious abdominal masses. Msk:  Strength and tone normal for age. Back: Minimal tenderness with palpation of right > left low back. No tenderness with percussion of costophrenic angles bilaterally. Extremities/Skin: Warm and dry Neuro: Alert and oriented X 3. Moves all extremities spontaneously. Gait is normal. CNII-XII grossly in tact. Psych:  Responds to questions appropriately with a normal affect.   Results for orders placed in visit on 12/19/13  URINALYSIS, ROUTINE W REFLEX MICROSCOPIC      Result Value Ref Range   Color, Urine YELLOW  YELLOW   APPearance CLEAR  CLEAR   Specific Gravity, Urine 1.015  1.005 - 1.030   pH 5.5  5.0 - 8.0   Glucose, UA NEG  NEG mg/dL   Bilirubin Urine NEG  NEG   Ketones, ur NEG  NEG mg/dL   Hgb urine dipstick NEG  NEG   Protein, ur NEG  NEG mg/dL   Urobilinogen, UA 0.2  0.0 - 1.0 mg/dL   Nitrite NEG  NEG   Leukocytes, UA NEG  NEG     ASSESSMENT AND PLAN:  58 y.o. year old female with  1. Low back pain Reassured her that her urinalysis is normal. I feel her low back pain is secondary to muscle strain. Discussed using heat in the form of a heating pad or hot water in the shower. Also discussed stretches to stretch the low back. Once this pain resolves she needs to do some exercises to strengthen her abdominals and low back muscles. Her current pain is controlled with Aleve so she can continue to use this over-the-counter medication--make sure to take with food.  2. Urgency of urination - Urinalysis, Routine w reflex microscopic   Signed, [address] [place],  [place], [facility] 12/19/2013 3:09 PM

## 2014-02-05 ENCOUNTER — Ambulatory Visit (INDEPENDENT_AMBULATORY_CARE_PROVIDER_SITE_OTHER): Payer: BC Managed Care – PPO | Admitting: Family Medicine

## 2014-02-05 ENCOUNTER — Encounter: Payer: Self-pay | Admitting: Family Medicine

## 2014-02-05 VITALS — BP 136/80 | HR 68 | Temp 98.5°F | Resp 14 | Ht 59.5 in | Wt 161.0 lb

## 2014-02-05 DIAGNOSIS — R109 Unspecified abdominal pain: Secondary | ICD-10-CM

## 2014-02-05 DIAGNOSIS — K5730 Diverticulosis of large intestine without perforation or abscess without bleeding: Secondary | ICD-10-CM

## 2014-02-05 DIAGNOSIS — K5790 Diverticulosis of intestine, part unspecified, without perforation or abscess without bleeding: Secondary | ICD-10-CM

## 2014-02-05 LAB — URINALYSIS, ROUTINE W REFLEX MICROSCOPIC
BILIRUBIN URINE: NEGATIVE
Glucose, UA: NEGATIVE mg/dL
Hgb urine dipstick: NEGATIVE
KETONES UR: NEGATIVE mg/dL
Leukocytes, UA: NEGATIVE
NITRITE: NEGATIVE
PH: 7 (ref 5.0–8.0)
Protein, ur: NEGATIVE mg/dL
SPECIFIC GRAVITY, URINE: 1.02 (ref 1.005–1.030)
Urobilinogen, UA: 0.2 mg/dL (ref 0.0–1.0)

## 2014-02-05 NOTE — Assessment & Plan Note (Signed)
I'm concerned about worsening pain. I will obtain some labs and a urinalysis I will send her for CT scan first thing in the morning. She does have a lot of scar tissue do to her previous surgeon surgery on her colon. I will need to rule out a partial small bowel structure versus diverticulitis or other abdominal process

## 2014-02-05 NOTE — Patient Instructions (Signed)
CT scan to be done We will call with labs  F/U as needed

## 2014-02-05 NOTE — Progress Notes (Signed)
Patient ID: [name], female   DOB: 03-Aug-1956, 58 y.o.   MRN: [id number]   Subjective:    Patient ID: [name], female    DOB: 1956/02/13, 58 y.o.   MRN: [id number]  Patient presents for Back pain and Abdominal pain  Patient here with low back pain and abdominal pain. She was seen for back pain back in February she's been taking Aleve which did help some. Over the past few weeks she has had back pain associated with abdominal pain she states she does not feel like herself and she is very concerned as she felt this way in the past when she had had a perforation in her colon requiring urgent surgery. She is moving her bowels okay however she does have more gas than normal it is also been belching. She denies any dysuria and had a urinalysis at the last visit which was normal. She denies any blood in the stool. She does get some pelvic discomfort as well from time to time but the pain tends to move around. She has some mild nausea associated but has not had any vomiting. She's been intentionally trying to lose weight. She watches her diet very carefully and has not had any changes recently.   Review Of Systems:  GEN- denies fatigue, fever, weight loss,weakness, recent illness HEENT- denies eye drainage, change in vision, nasal discharge, CVS- denies chest pain, palpitations RESP- denies SOB, cough, wheeze ABD- denies N/V, change in stools, +abd pain GU- denies dysuria, hematuria, dribbling, incontinence Neuro- denies headache, dizziness, syncope, seizure activity       Objective:    BP 136/80  Pulse 68  Temp(Src) 98.5 F (36.9 C) (Oral)  Resp 14  Ht 4' 11.5" (1.511 m)  Wt 161 lb (73.029 kg)  BMI 31.99 kg/m2 GEN- NAD, alert and oriented x3 HEENT- PERRL, EOMI, non injected sclera, pink conjunctiva, MMM, oropharynx clear Neck- Supple, no LAD CVS- RRR, no murmur RESP-CTAB ABD-High pitched BS,soft,TTP lower quadrants, no rebound, no mass palpated, midline scar- small  ventral wall hernia,ND, no CVA tenderness EXT- No edema Pulses- Radial 2+        Assessment & Plan:      Problem List Items Addressed This Visit   None    Visit Diagnoses   Abdominal pain, unspecified site    -  Primary    Relevant Orders       Comprehensive metabolic panel       CBC with Differential       Urinalysis, Routine w reflex microscopic (Completed)       CT Abdomen Pelvis W Contrast       Lipase       Note: This dictation was prepared with Dragon dictation along with smaller phrase technology. Any transcriptional errors that result from this process are unintentional.

## 2014-02-06 ENCOUNTER — Ambulatory Visit (HOSPITAL_COMMUNITY): Payer: BC Managed Care – PPO

## 2014-02-06 ENCOUNTER — Encounter (HOSPITAL_COMMUNITY): Payer: Self-pay | Admitting: Emergency Medicine

## 2014-02-06 ENCOUNTER — Ambulatory Visit (INDEPENDENT_AMBULATORY_CARE_PROVIDER_SITE_OTHER): Payer: BC Managed Care – PPO | Admitting: Family Medicine

## 2014-02-06 ENCOUNTER — Telehealth: Payer: Self-pay | Admitting: *Deleted

## 2014-02-06 ENCOUNTER — Emergency Department (HOSPITAL_COMMUNITY)
Admission: EM | Admit: 2014-02-06 | Discharge: 2014-02-06 | Disposition: A | Discharge status: Home / Self Care | Payer: BC Managed Care – PPO | Attending: Emergency Medicine | Admitting: Emergency Medicine

## 2014-02-06 VITALS — BP 128/78 | HR 80 | Resp 26

## 2014-02-06 DIAGNOSIS — IMO0002 Reserved for concepts with insufficient information to code with codable children: Secondary | ICD-10-CM | POA: Insufficient documentation

## 2014-02-06 DIAGNOSIS — Z79899 Other long term (current) drug therapy: Secondary | ICD-10-CM | POA: Insufficient documentation

## 2014-02-06 DIAGNOSIS — K219 Gastro-esophageal reflux disease without esophagitis: Secondary | ICD-10-CM | POA: Insufficient documentation

## 2014-02-06 DIAGNOSIS — T50995A Adverse effect of other drugs, medicaments and biological substances, initial encounter: Secondary | ICD-10-CM | POA: Insufficient documentation

## 2014-02-06 DIAGNOSIS — Z862 Personal history of diseases of the blood and blood-forming organs and certain disorders involving the immune mechanism: Secondary | ICD-10-CM | POA: Insufficient documentation

## 2014-02-06 DIAGNOSIS — Z87440 Personal history of urinary (tract) infections: Secondary | ICD-10-CM | POA: Insufficient documentation

## 2014-02-06 DIAGNOSIS — R011 Cardiac murmur, unspecified: Secondary | ICD-10-CM | POA: Insufficient documentation

## 2014-02-06 DIAGNOSIS — M7989 Other specified soft tissue disorders: Secondary | ICD-10-CM | POA: Insufficient documentation

## 2014-02-06 DIAGNOSIS — H5789 Other specified disorders of eye and adnexa: Secondary | ICD-10-CM | POA: Insufficient documentation

## 2014-02-06 DIAGNOSIS — Z888 Allergy status to other drugs, medicaments and biological substances status: Secondary | ICD-10-CM

## 2014-02-06 DIAGNOSIS — L509 Urticaria, unspecified: Secondary | ICD-10-CM | POA: Insufficient documentation

## 2014-02-06 DIAGNOSIS — Z8639 Personal history of other endocrine, nutritional and metabolic disease: Secondary | ICD-10-CM | POA: Insufficient documentation

## 2014-02-06 DIAGNOSIS — T508X5A Adverse effect of diagnostic agents, initial encounter: Secondary | ICD-10-CM

## 2014-02-06 DIAGNOSIS — N189 Chronic kidney disease, unspecified: Secondary | ICD-10-CM | POA: Insufficient documentation

## 2014-02-06 DIAGNOSIS — R6889 Other general symptoms and signs: Secondary | ICD-10-CM | POA: Insufficient documentation

## 2014-02-06 LAB — CBC WITH DIFFERENTIAL/PLATELET
Basophils Absolute: 0 10*3/uL (ref 0.0–0.1)
Basophils Relative: 0 % (ref 0–1)
EOS PCT: 1 % (ref 0–5)
Eosinophils Absolute: 0.1 10*3/uL (ref 0.0–0.7)
HEMATOCRIT: 42.4 % (ref 36.0–46.0)
HEMOGLOBIN: 14.4 g/dL (ref 12.0–15.0)
LYMPHS ABS: 2.8 10*3/uL (ref 0.7–4.0)
LYMPHS PCT: 34 % (ref 12–46)
MCH: 30.5 pg (ref 26.0–34.0)
MCHC: 34 g/dL (ref 30.0–36.0)
MCV: 89.8 fL (ref 78.0–100.0)
MONO ABS: 0.7 10*3/uL (ref 0.1–1.0)
Monocytes Relative: 8 % (ref 3–12)
Neutro Abs: 4.7 10*3/uL (ref 1.7–7.7)
Neutrophils Relative %: 57 % (ref 43–77)
Platelets: 287 10*3/uL (ref 150–400)
RBC: 4.72 MIL/uL (ref 3.87–5.11)
RDW: 13.7 % (ref 11.5–15.5)
WBC: 8.2 10*3/uL (ref 4.0–10.5)

## 2014-02-06 LAB — COMPREHENSIVE METABOLIC PANEL
ALT: 38 U/L — ABNORMAL HIGH (ref 0–35)
AST: 24 U/L (ref 0–37)
Albumin: 4.2 g/dL (ref 3.5–5.2)
Alkaline Phosphatase: 60 U/L (ref 39–117)
BILIRUBIN TOTAL: 0.4 mg/dL (ref 0.2–1.2)
BUN: 13 mg/dL (ref 6–23)
CO2: 24 meq/L (ref 19–32)
CREATININE: 0.78 mg/dL (ref 0.50–1.10)
Calcium: 9.8 mg/dL (ref 8.4–10.5)
Chloride: 107 mEq/L (ref 96–112)
GLUCOSE: 87 mg/dL (ref 70–99)
Potassium: 4.1 mEq/L (ref 3.5–5.3)
Sodium: 140 mEq/L (ref 135–145)
Total Protein: 6.3 g/dL (ref 6.0–8.3)

## 2014-02-06 LAB — LIPASE: LIPASE: 47 U/L (ref 0–75)

## 2014-02-06 MED ORDER — DIPHENHYDRAMINE HCL 50 MG/ML IJ SOLN
25.0000 mg | Freq: Once | INTRAMUSCULAR | Status: AC
  Administered 2014-02-06: 25 mg via INTRAVENOUS
  Filled 2014-02-06: qty 1

## 2014-02-06 MED ORDER — EPINEPHRINE HCL 1 MG/ML IJ SOLN
1.0000 mg | Freq: Once | INTRAMUSCULAR | Status: AC
  Administered 2014-02-06: 1 mg via INTRAMUSCULAR

## 2014-02-06 MED ORDER — EPINEPHRINE 0.3 MG/0.3ML IJ SOAJ: 0.3000 mg | Freq: Once | INTRAMUSCULAR | Status: DC | PRN

## 2014-02-06 MED ORDER — ONDANSETRON HCL 4 MG/2ML IJ SOLN
4.0000 mg | Freq: Once | INTRAMUSCULAR | Status: AC
  Administered 2014-02-06: 4 mg via INTRAVENOUS
  Filled 2014-02-06: qty 2

## 2014-02-06 MED ORDER — EPINEPHRINE 0.3 MG/0.3ML IJ SOAJ
0.3000 mg | Freq: Once | INTRAMUSCULAR | Status: AC
  Administered 2014-02-06: 0.3 mg via INTRAMUSCULAR
  Filled 2014-02-06: qty 0.3

## 2014-02-06 MED ORDER — PREDNISONE 20 MG PO TABS: ORAL_TABLET | ORAL | Status: DC

## 2014-02-06 MED ORDER — EPINEPHRINE HCL 1 MG/ML IJ SOLN: 1.0000 mg | Freq: Once | INTRAMUSCULAR | Status: DC

## 2014-02-06 MED ORDER — METHYLPREDNISOLONE SODIUM SUCC 125 MG IJ SOLR
125.0000 mg | Freq: Once | INTRAMUSCULAR | Status: AC
  Administered 2014-02-06: 125 mg via INTRAVENOUS
  Filled 2014-02-06: qty 2

## 2014-02-06 MED ORDER — FAMOTIDINE 40 MG PO TABS: 40.0000 mg | ORAL_TABLET | Freq: Two times a day (BID) | ORAL | Status: DC

## 2014-02-06 NOTE — ED Notes (Signed)
She is in no distress.  She states she formerly had had widespread hives, which are now gone.  Her face is mildly flushed and minimally edematous, as are her hands.  She denies any throat/tongue swelling, and is breathing normally.

## 2014-02-06 NOTE — Telephone Encounter (Signed)
Message copied by Sheral Flow on Wed Feb 06, 2014  9:05 AM ------      Message from: Devoria Glassing      Created: Wed Feb 06, 2014  8:55 AM       Patient was here yesterday and had to drink some contrast for ct scan she is having, she is now breaking out in hives she says either allergic rxn or stress please call her at [phone] she wants to know if she can take benadryl  ------

## 2014-02-06 NOTE — ED Notes (Signed)
Bed: SJ62 Expected date:  Expected time:  Means of arrival:  Comments: EMS- Allergic reaction

## 2014-02-06 NOTE — ED Provider Notes (Signed)
CSN: [id number]     Arrival date & time 02/06/14  1009 History   First MD Initiated Contact with Patient 02/06/14 1034     Chief Complaint  Patient presents with  . Allergic Reaction     (Consider location/radiation/quality/duration/timing/severity/associated sxs/prior Treatment) HPI  Patient reports she was drinking some blueberry flavored contrast so she can have a CT scan done. She drank one cup and then 15 minutes later she started feeling burning all over her body and then broke out in hives. She denied any swelling of her throat or difficulty breathing. She went to her doctor's office which was close by her house. She reports she took 50 mg of Benadryl at home pregnancy she states she took 2 over-the-counter) and she was given epi 0.3 cc at her doctor's office. EMS was called and she received Zantac 50 mg IV and Zofran for nausea. Patient was feeling fine when she first arrived in the ED however at the time of my exam she was starting to feel a burning sensation in the right side of her throat.  She reports several years ago she organic blueberries from [place] and had allergic reactions that time. However she states she ate blueberries a couple weeks ago with no problem.  PCP Dr. Dennard Schaumann at [facility] family practice  Past Medical History  Diagnosis Date  . Diverticulitis of large intestine with perforation 02/07/2012  . PONV (postoperative nausea and vomiting)   . Heart murmur     as a child   . Hypothyroidism     hx of   . Chronic kidney disease     hx of uti   . GERD (gastroesophageal reflux disease)    Past Surgical History  Procedure Laterality Date  . Rotator cuff repair    . Tubal ligation    . Nose surgery    . Laparotomy  02/07/2012    Procedure: EXPLORATORY LAPAROTOMY;  Surgeon: Gayland Curry, MD,FACS;  Location: [place];  Service: General;  Laterality: Bilateral;  . Partial colectomy  02/07/2012    Procedure: PARTIAL COLECTOMY;  Surgeon: Gayland Curry, MD,FACS;   Location: [place];  Service: General;  Laterality: N/A;  . Colostomy  02/07/2012    Procedure: COLOSTOMY;  Surgeon: Gayland Curry, MD,FACS;  Location: [place];  Service: General;  Laterality: Left;  . Breast surgery      right benign breast lump removed   . Colostomy takedown  09/01/2012    Procedure: LAPAROSCOPIC COLOSTOMY TAKEDOWN;  Surgeon: Gayland Curry, MD,FACS;  Location: [facility];  Service: General;  Laterality: N/A;  Lap Assisted Colostomy Reversal   . Laparoscopic sigmoid colectomy  09/01/2012    Procedure: LAPAROSCOPIC SIGMOID COLECTOMY;  Surgeon: Gayland Curry, MD,FACS;  Location: [facility];  Service: General;;  . Flexible sigmoidoscopy  09/01/2012    Procedure: Beryle Quant;  Surgeon: Gayland Curry, MD,FACS;  Location: [facility];  Service: General;;   Family History  Problem Relation Age of Onset  . Diabetes Father   . Hypertension Father    History  Substance Use Topics  . Smoking status: Never Smoker   . Smokeless tobacco: Never Used  . Alcohol Use: No   Lives at home Lives with spouse  OB History   Grav Para Term Preterm Abortions TAB SAB Ect Mult Living                 Review of Systems  All other systems reviewed and are negative.  Allergies  Contrast media and Other  Home Medications   Current Outpatient Rx  Name  Route  Sig  Dispense  Refill  . Cholecalciferol (VITAMIN D-3 PO)   Oral   Take 1 drop by mouth daily. 2000IU/drop         . diphenhydrAMINE (BENADRYL) 25 MG tablet   Oral   Take 25 mg by mouth as needed.         Marland Kitchen MAGNESIUM-ZINC PO   Oral   Take 1 tablet by mouth daily. Magnesium 400 mg & Zinc 15 mg         . Multiple Vitamins-Minerals (ALIVE WOMENS 50+) TABS   Oral   Take 1 tablet by mouth daily. Gummy vitamin - 2 for adults         . Plant Sterols and Stanols (CHOLESTOFF PO)   Oral   Take 2 tablets by mouth daily.         . Probiotic Product (PROBIOTIC DAILY PO)   Oral   Take 1 capsule by mouth daily.  Prescript-assist broad spectrum probiotic & prebiotic         . UNABLE TO FIND   Oral   Take 1 capsule by mouth daily. [address] Flaxseed, [place], Omega 3,6,9         . UNABLE TO FIND   Oral   Take 2 scoop by mouth daily. Beef gelatin, collagen joint care.  2 tablespoons a day         . EPINEPHrine (EPIPEN) 0.3 mg/0.3 mL SOAJ injection   Intramuscular   Inject 0.3 mLs (0.3 mg total) into the muscle once as needed (allergic reaction then proceed to the ED).   2 Device   0   . famotidine (PEPCID) 40 MG tablet   Oral   Take 1 tablet (40 mg total) by mouth 2 (two) times daily. .   20 tablet   0   . predniSONE (DELTASONE) 20 MG tablet      Take 3 po QD x 2d starting tomorrow, then 2 po QD x 3d then 1 po QD x 3d   15 tablet   0    BP 110/51  Pulse 90  Temp(Src) 98.3 F (36.8 C) (Oral)  Resp 20  SpO2 98% Physical Exam  Nursing note and vitals reviewed. Constitutional: She is oriented to person, place, and time. She appears well-developed and well-nourished.  Non-toxic appearance. She does not appear ill. No distress.  HENT:  Head: Normocephalic and atraumatic.  Right Ear: External ear normal.  Left Ear: External ear normal.  Nose: Nose normal. No mucosal edema or rhinorrhea.  Mouth/Throat: Oropharynx is clear and moist and mucous membranes are normal. No dental abscesses or uvula swelling.  Eyes: Conjunctivae and EOM are normal. Pupils are equal, round, and reactive to light.  Patient has mild swelling of her upper eyelids  Neck: Normal range of motion and full passive range of motion without pain. Neck supple.  Cardiovascular: Normal rate, regular rhythm and normal heart sounds.  Exam reveals no gallop and no friction rub.   No murmur heard. Pulmonary/Chest: Effort normal and breath sounds normal. No respiratory distress. She has no wheezes. She has no rhonchi. She has no rales. She exhibits no tenderness and no crepitus.  Abdominal: Soft. Normal  appearance and bowel sounds are normal. She exhibits no distension. There is no tenderness. There is no rebound and no guarding.  Musculoskeletal: Normal range of motion. She exhibits no edema and  no tenderness.  Moves all extremities well.   Neurological: She is alert and oriented to person, place, and time. She has normal strength. No cranial nerve deficit.  Skin: Skin is warm, dry and intact. No rash noted. No erythema. No pallor.  There is no rash present at this time. She has mild swelling of the dorsum of her hands especially around the MCP joints of her fingers.  Psychiatric: She has a normal mood and affect. Her speech is normal and behavior is normal. Her mood appears not anxious.    ED Course  Procedures (including critical care time)  Medications  methylPREDNISolone sodium succinate (SOLU-MEDROL) 125 mg/2 mL injection 125 mg (125 mg Intravenous Given 02/06/14 1134)  diphenhydrAMINE (BENADRYL) injection 25 mg (25 mg Intravenous Given 02/06/14 1134)  EPINEPHrine (EPI-PEN) injection 0.3 mg (0.3 mg Intramuscular Given 02/06/14 1134)  ondansetron (ZOFRAN) injection 4 mg (4 mg Intravenous Given 02/06/14 1159)  diphenhydrAMINE (BENADRYL) injection 25 mg (25 mg Intravenous Given 02/06/14 1255)  EPINEPHrine (EPI-PEN) injection 0.3 mg (0.3 mg Intramuscular Given 02/06/14 1254)   In addition to what patient had received by EMS she was given Solu-Medrol IV, another dose of Benadryl and a second epinephrine injection.  12:00 nurse reports patient has nausea. She was given Zofran  1240 patient was rechecked. She continues to have some swelling of her eyelids in her hands. She did not develop hypertension from the epinephrine injections. Her epinephrine injection was repeated. She is also wide awake, she was given another dose of Benadryl   Recheck at 1400 patient states the discomfort in her throat is gone, the rash is gone, she still has some mild swelling of her eyelids and hands however they are  improved. She feels comfortable going home at this point.   Labs Review Labs Reviewed - No data to display Imaging Review No results found.   EKG Interpretation None      MDM   Final diagnoses:  Allergic reaction to contrast material    New Prescriptions   EPINEPHRINE (EPIPEN) 0.3 MG/0.3 ML SOAJ INJECTION    Inject 0.3 mLs (0.3 mg total) into the muscle once as needed (allergic reaction then proceed to the ED).   FAMOTIDINE (PEPCID) 40 MG TABLET    Take 1 tablet (40 mg total) by mouth 2 (two) times daily. Marland Kitchen   PREDNISONE (DELTASONE) 20 MG TABLET    Take 3 po QD x 2d starting tomorrow, then 2 po QD x 3d then 1 po QD x 3d    Plan discharge  Rolland Porter, MD, [place] Performed by: Twyla Dais L Refael Fulop Total critical care time: 31 min Critical care time was exclusive of separately billable procedures and treating other patients. Critical care was necessary to treat or prevent imminent or life-threatening deterioration. Critical care was time spent personally by me on the following activities: development of treatment plan with patient and/or surrogate as well as nursing, discussions with consultants, evaluation of patient's response to treatment, examination of patient, obtaining history from patient or surrogate, ordering and performing treatments and interventions, ordering and review of laboratory studies, ordering and review of radiographic studies, pulse oximetry and re-evaluation of patient's condition.   Janice Norrie, MD 02/06/14 1426

## 2014-02-06 NOTE — ED Notes (Signed)
Pt escorted to discharge window. Verbalized understanding discharge instructions. In no acute distress.  Vitals reviewed.

## 2014-02-06 NOTE — Progress Notes (Signed)
Patient ID: [name], female   DOB: Oct 24, 1956, 58 y.o.   MRN: [id number]   Subjective:    Patient ID: [name], female    DOB: 09/10/1956, 58 y.o.   MRN: [id number]  Patient presents for allergic reaction to contrast  patient presents with allergic reaction. She started drinking her contrast for her CT scan around 8:15. Approximately 15 minutes later she noticed a red rash and swelling of her face and she began to have difficulty breathing. She took 2 Benadryl and her husband drove her to our office. Upon initial evaluation she has swelling of the face and eyelids as well as the hand she had erythematous rash across her face neck chest abdomen and legs. EpiPen was given to her at 910 and oxygen was started subsequently thereafter at 6 L. Her oxygen sat was 98% her initial blood pressure 128/78 her heart rate was about 67. She was having some labored breathing and was complaining of pain in the abdomen as well as nausea. EMS was called quickly. IV was started Zofran was administered and she's been sent to [facility] for further evaluation    Review Of Systems: - PER ABOVE - unable to obtain complete ROS         Objective:    BP 128/78  Pulse 80  Resp 26 GEN- NAD, alert and oriented x3 HEENT- PERRL, EOMI, non injected sclera, pink conjunctiva, MMM, oropharynx clear, swelling of face and eyelids CVS- RRR, no murmur RESP-breathing labored, moving air, no wheezing, sat 98% Skin- erythematous rash on abdomen, chest, arms, leg, face, neck  ABD-NABS,soft,TTP upper qaudrants EXT- + edema face and hands Pulses- Radial 2+         Assessment & Plan:      Problem List Items Addressed This Visit   Allergic reaction to contrast dye - Primary     Sent to [facility] by EMS for further care    Relevant Medications      EPINEPHrine (ADRENALIN) injection 1 mg      Note: This dictation was prepared with Dragon dictation along with smaller phrase technology. Any transcriptional errors that  result from this process are unintentional.

## 2014-02-06 NOTE — Discharge Instructions (Signed)
Continue benadryl 35-50 mg every 4 hrs as needed for itching or swelling. You could also take zyrtec OTC once daily which would be less sedating. Take the prednisone and pepcid until gone. Use the epipen if you get a reaction and you have difficulty breathing or swallowing then proceed to the nearest ED.

## 2014-02-06 NOTE — ED Notes (Signed)
Dr. Tomi Bamberger has just examined her again.  Pt. Still perceives facial edema and bilat. Hand edema, however, this is minimal per physical exam.  Pt. Also tells [place] that "my throat swelling is gone".  She speaks without difficulty and remains oriented x 4.

## 2014-02-06 NOTE — ED Notes (Signed)
She continues to pleasantly visit with her husband; and has ambulated to b.r. Again.  Her face remains mildly flushed and she is breathing normally with her speech being clear.  She continues to deny any feeling of throat swelling/tightness at present.

## 2014-02-06 NOTE — ED Notes (Signed)
Per [facility]- Pt at home drinking contrast containing blueberry flavoring. Pt has drank this contrast before. Pt c/o hives, tingling swelling to lips and difficulty breathing. Benadryl 25 mg PO taken at home. Epi 0.3 given at Dr. Neita Carp [facility] for reaction. On scene Zantac 50 mg IV by EMS and Zofran 4mg  IV. Pt  Tolerated. Pt swelling and hives reduced

## 2014-02-06 NOTE — Assessment & Plan Note (Signed)
Sent to [facility] by EMS for further care

## 2014-02-06 NOTE — ED Notes (Signed)
She had experienced a recurrence of throat "fullness" and "feels like the allergy was coming back".  At no time was she in distress, nor did she have any stridor.  Dr. Tomi Bamberger orders additional meds, which I have given.  She ambulates without difficulty to b.r., after which she tells [place] she is nauseated--order rec'd. For IV Zofran.

## 2014-02-06 NOTE — Telephone Encounter (Signed)
Call returned to patient.  Patient reports that she is covered in hives and face and lips are swollen.   Reports that she has taken 2 benadryls.   States she is close to office.   Will work in.

## 2014-02-19 ENCOUNTER — Telehealth: Payer: Self-pay | Admitting: *Deleted

## 2014-02-19 DIAGNOSIS — K5792 Diverticulitis of intestine, part unspecified, without perforation or abscess without bleeding: Secondary | ICD-10-CM

## 2014-02-19 DIAGNOSIS — J3089 Other allergic rhinitis: Secondary | ICD-10-CM

## 2014-02-19 DIAGNOSIS — R109 Unspecified abdominal pain: Secondary | ICD-10-CM

## 2014-02-19 NOTE — Telephone Encounter (Signed)
Return call placed to patient.   [name].

## 2014-02-19 NOTE — Telephone Encounter (Signed)
Message copied by Sheral Flow on Tue Feb 19, 2014  4:18 PM ------      Message from: Devoria Glassing      Created: Tue Feb 19, 2014  3:43 PM       Patient is calling to see if she can get referral to an allergist and also would like to be scheduled for a ct scan [phone] ------

## 2014-02-20 NOTE — Telephone Encounter (Signed)
Call placed to patient and patient made aware.  

## 2014-02-20 NOTE — Telephone Encounter (Signed)
Call returned by patient.   States that since reaction to contrast dye, she has noted that she is having reactions to coffee and a few other things.   Requested referral to allergist to have skin test performed.   Also states that with reactions she has noted that coffee is causing her abdomen to hurt. She believes that this may be part of what's causing her abdominal pain.  MD please advise.

## 2014-02-20 NOTE — Telephone Encounter (Signed)
CT scan to be rescheduled Allergy referral placed

## 2014-04-24 ENCOUNTER — Other Ambulatory Visit (HOSPITAL_COMMUNITY): Payer: Self-pay | Admitting: Certified Nurse Midwife

## 2014-04-24 DIAGNOSIS — R102 Pelvic and perineal pain: Secondary | ICD-10-CM

## 2014-04-24 DIAGNOSIS — K824 Cholesterolosis of gallbladder: Secondary | ICD-10-CM

## 2014-04-30 ENCOUNTER — Ambulatory Visit (HOSPITAL_COMMUNITY): Payer: BC Managed Care – PPO

## 2014-04-30 ENCOUNTER — Ambulatory Visit (HOSPITAL_COMMUNITY)
Admission: RE | Admit: 2014-04-30 | Discharge: 2014-04-30 | Disposition: A | Discharge status: Home / Self Care | Payer: BC Managed Care – PPO | Source: Ambulatory Visit | Attending: Certified Nurse Midwife | Admitting: Certified Nurse Midwife

## 2014-04-30 DIAGNOSIS — N854 Malposition of uterus: Secondary | ICD-10-CM | POA: Insufficient documentation

## 2014-04-30 DIAGNOSIS — R9389 Abnormal findings on diagnostic imaging of other specified body structures: Secondary | ICD-10-CM | POA: Insufficient documentation

## 2014-04-30 DIAGNOSIS — K824 Cholesterolosis of gallbladder: Secondary | ICD-10-CM | POA: Insufficient documentation

## 2014-04-30 DIAGNOSIS — R102 Pelvic and perineal pain: Secondary | ICD-10-CM

## 2014-06-10 ENCOUNTER — Other Ambulatory Visit: Payer: Self-pay

## 2014-06-10 DIAGNOSIS — Z1231 Encounter for screening mammogram for malignant neoplasm of breast: Secondary | ICD-10-CM

## 2014-07-03 ENCOUNTER — Other Ambulatory Visit: Payer: Self-pay | Admitting: Obstetrics and Gynecology

## 2014-07-05 ENCOUNTER — Encounter (HOSPITAL_COMMUNITY): Payer: Self-pay | Admitting: Pharmacist

## 2014-07-09 ENCOUNTER — Ambulatory Visit
Admission: RE | Admit: 2014-07-09 | Discharge: 2014-07-09 | Disposition: A | Discharge status: Home / Self Care | Payer: BC Managed Care – PPO | Source: Ambulatory Visit

## 2014-07-09 DIAGNOSIS — Z1231 Encounter for screening mammogram for malignant neoplasm of breast: Secondary | ICD-10-CM

## 2014-07-15 ENCOUNTER — Encounter (HOSPITAL_COMMUNITY)
Admission: RE | Admit: 2014-07-15 | Discharge: 2014-07-15 | Disposition: A | Discharge status: Home / Self Care | Payer: BC Managed Care – PPO | Source: Ambulatory Visit | Attending: Obstetrics and Gynecology | Admitting: Obstetrics and Gynecology

## 2014-07-15 ENCOUNTER — Encounter (HOSPITAL_COMMUNITY): Payer: Self-pay

## 2014-07-15 DIAGNOSIS — R9389 Abnormal findings on diagnostic imaging of other specified body structures: Secondary | ICD-10-CM | POA: Diagnosis present

## 2014-07-15 DIAGNOSIS — K219 Gastro-esophageal reflux disease without esophagitis: Secondary | ICD-10-CM | POA: Diagnosis not present

## 2014-07-15 DIAGNOSIS — N950 Postmenopausal bleeding: Secondary | ICD-10-CM | POA: Diagnosis not present

## 2014-07-15 LAB — CBC
HCT: 48.3 % — ABNORMAL HIGH (ref 36.0–46.0)
Hemoglobin: 16.2 g/dL — ABNORMAL HIGH (ref 12.0–15.0)
MCH: 30.7 pg (ref 26.0–34.0)
MCHC: 33.5 g/dL (ref 30.0–36.0)
MCV: 91.5 fL (ref 78.0–100.0)
PLATELETS: 279 10*3/uL (ref 150–400)
RBC: 5.28 MIL/uL — ABNORMAL HIGH (ref 3.87–5.11)
RDW: 13.8 % (ref 11.5–15.5)
WBC: 7.1 10*3/uL (ref 4.0–10.5)

## 2014-07-15 NOTE — Patient Instructions (Signed)
Your procedure is scheduled on:07/17/14  Enter through the Main Entrance at :12:45 pm Pick up desk phone and dial [phone] and inform us of your arrival.  Please call [phone] if you have any problems the morning of surgery.  Remember: Do not eat food after midnight: Tuesday Clear liquids are ok until:10 am on Wed  You may brush your teeth the morning of surgery.  DO NOT wear jewelry, eye make-up, lipstick,body lotion, or dark fingernail polish.  (Polished toes are ok) You may wear deodorant.  If you are to be admitted after surgery, leave suitcase in car until your room has been assigned. Patients discharged on the day of surgery will not be allowed to drive home. Wear loose fitting, comfortable clothes for your ride home.

## 2014-07-17 ENCOUNTER — Encounter (HOSPITAL_COMMUNITY): Payer: Self-pay | Admitting: Certified Registered Nurse Anesthetist

## 2014-07-17 ENCOUNTER — Ambulatory Visit (HOSPITAL_COMMUNITY)
Admission: RE | Admit: 2014-07-17 | Discharge: 2014-07-17 | Disposition: A | Discharge status: Home / Self Care | Payer: BC Managed Care – PPO | Source: Ambulatory Visit | Attending: Obstetrics and Gynecology | Admitting: Obstetrics and Gynecology

## 2014-07-17 ENCOUNTER — Encounter (HOSPITAL_COMMUNITY)
Admission: RE | Disposition: A | Discharge status: Home / Self Care | Payer: Self-pay | Source: Ambulatory Visit | Attending: Obstetrics and Gynecology

## 2014-07-17 ENCOUNTER — Encounter (HOSPITAL_COMMUNITY): Payer: BC Managed Care – PPO | Admitting: Anesthesiology

## 2014-07-17 ENCOUNTER — Ambulatory Visit (HOSPITAL_COMMUNITY): Payer: BC Managed Care – PPO | Admitting: Anesthesiology

## 2014-07-17 DIAGNOSIS — R9389 Abnormal findings on diagnostic imaging of other specified body structures: Secondary | ICD-10-CM | POA: Diagnosis not present

## 2014-07-17 DIAGNOSIS — K219 Gastro-esophageal reflux disease without esophagitis: Secondary | ICD-10-CM | POA: Insufficient documentation

## 2014-07-17 DIAGNOSIS — N950 Postmenopausal bleeding: Secondary | ICD-10-CM | POA: Insufficient documentation

## 2014-07-17 HISTORY — PX: HYSTEROSCOPY W/D&C: SHX1775

## 2014-07-17 SURGERY — DILATATION AND CURETTAGE /HYSTEROSCOPY
Anesthesia: General | Site: Vagina

## 2014-07-17 MED ORDER — LIDOCAINE HCL (CARDIAC) 20 MG/ML IV SOLN
INTRAVENOUS | Status: AC
  Filled 2014-07-17: qty 5

## 2014-07-17 MED ORDER — KETOROLAC TROMETHAMINE 30 MG/ML IJ SOLN
INTRAMUSCULAR | Status: AC
  Filled 2014-07-17: qty 1

## 2014-07-17 MED ORDER — DEXAMETHASONE SODIUM PHOSPHATE 4 MG/ML IJ SOLN
INTRAMUSCULAR | Status: AC
  Filled 2014-07-17: qty 1

## 2014-07-17 MED ORDER — MIDAZOLAM HCL 2 MG/2ML IJ SOLN
INTRAMUSCULAR | Status: AC
  Filled 2014-07-17: qty 2

## 2014-07-17 MED ORDER — MEPERIDINE HCL 25 MG/ML IJ SOLN: 6.2500 mg | INTRAMUSCULAR | Status: DC | PRN

## 2014-07-17 MED ORDER — DEXAMETHASONE SODIUM PHOSPHATE 10 MG/ML IJ SOLN
INTRAMUSCULAR | Status: DC | PRN
  Administered 2014-07-17: 10 mg via INTRAVENOUS

## 2014-07-17 MED ORDER — KETOROLAC TROMETHAMINE 30 MG/ML IJ SOLN
INTRAMUSCULAR | Status: DC | PRN
  Administered 2014-07-17: 30 mg via INTRAVENOUS

## 2014-07-17 MED ORDER — DIPHENHYDRAMINE HCL 50 MG/ML IJ SOLN
INTRAMUSCULAR | Status: AC
  Filled 2014-07-17: qty 1

## 2014-07-17 MED ORDER — ONDANSETRON HCL 4 MG/2ML IJ SOLN
INTRAMUSCULAR | Status: DC | PRN
  Administered 2014-07-17: 4 mg via INTRAVENOUS

## 2014-07-17 MED ORDER — FENTANYL CITRATE 0.05 MG/ML IJ SOLN
25.0000 ug | INTRAMUSCULAR | Status: DC | PRN
Start: 2014-07-17 — End: 2014-07-17

## 2014-07-17 MED ORDER — FENTANYL CITRATE 0.05 MG/ML IJ SOLN
INTRAMUSCULAR | Status: AC
  Filled 2014-07-17: qty 2

## 2014-07-17 MED ORDER — SCOPOLAMINE 1 MG/3DAYS TD PT72
1.0000 | MEDICATED_PATCH | Freq: Once | TRANSDERMAL | Status: DC
  Administered 2014-07-17: 1.5 mg via TRANSDERMAL

## 2014-07-17 MED ORDER — PROPOFOL 10 MG/ML IV EMUL
INTRAVENOUS | Status: AC
  Filled 2014-07-17: qty 20

## 2014-07-17 MED ORDER — LIDOCAINE HCL (CARDIAC) 20 MG/ML IV SOLN
INTRAVENOUS | Status: DC | PRN
  Administered 2014-07-17: 80 mg via INTRAVENOUS

## 2014-07-17 MED ORDER — FENTANYL CITRATE 0.05 MG/ML IJ SOLN
INTRAMUSCULAR | Status: DC | PRN
Start: 2014-07-17 — End: 2014-07-17
  Administered 2014-07-17 (×2): 50 ug via INTRAVENOUS

## 2014-07-17 MED ORDER — ONDANSETRON HCL 4 MG/2ML IJ SOLN
INTRAMUSCULAR | Status: AC
  Filled 2014-07-17: qty 2

## 2014-07-17 MED ORDER — PROPOFOL 10 MG/ML IV BOLUS
INTRAVENOUS | Status: DC | PRN
  Administered 2014-07-17: 20 mg via INTRAVENOUS
  Administered 2014-07-17: 40 mg via INTRAVENOUS
  Administered 2014-07-17: 140 mg via INTRAVENOUS

## 2014-07-17 MED ORDER — LACTATED RINGERS IV SOLN
INTRAVENOUS | Status: DC
  Administered 2014-07-17: 13:00:00 via INTRAVENOUS

## 2014-07-17 MED ORDER — PROPOFOL INFUSION 10 MG/ML OPTIME
INTRAVENOUS | Status: DC | PRN
  Administered 2014-07-17: 150 ug/kg/min via INTRAVENOUS
  Administered 2014-07-17: 125 ug/kg/min via INTRAVENOUS

## 2014-07-17 MED ORDER — GLYCINE 1.5 % IR SOLN
Status: DC | PRN
  Administered 2014-07-17: 3000 mL

## 2014-07-17 MED ORDER — IBUPROFEN 800 MG PO TABS: 800.0000 mg | ORAL_TABLET | Freq: Three times a day (TID) | ORAL | Status: DC | PRN

## 2014-07-17 MED ORDER — CHLOROPROCAINE HCL 1 % IJ SOLN
INTRAMUSCULAR | Status: AC
  Filled 2014-07-17: qty 30

## 2014-07-17 MED ORDER — DIPHENHYDRAMINE HCL 50 MG/ML IJ SOLN
INTRAMUSCULAR | Status: DC | PRN
  Administered 2014-07-17: 25 mg via INTRAVENOUS

## 2014-07-17 MED ORDER — MIDAZOLAM HCL 2 MG/2ML IJ SOLN
INTRAMUSCULAR | Status: DC | PRN
  Administered 2014-07-17: 2 mg via INTRAVENOUS

## 2014-07-17 MED ORDER — METOCLOPRAMIDE HCL 5 MG/ML IJ SOLN: 10.0000 mg | Freq: Once | INTRAMUSCULAR | Status: DC | PRN

## 2014-07-17 SURGICAL SUPPLY — 18 items
CANISTER SUCT 3000ML (MISCELLANEOUS) ×3 IMPLANT
CATH ROBINSON RED A/P 16FR (CATHETERS) ×3 IMPLANT
CLOTH BEACON ORANGE TIMEOUT ST (SAFETY) ×3 IMPLANT
CONTAINER PREFILL 10% NBF 60ML (FORM) ×6 IMPLANT
DRAPE HYSTEROSCOPY (DRAPE) ×3 IMPLANT
ELECT REM PT RETURN 9FT ADLT (ELECTROSURGICAL) ×3
ELECTRODE REM PT RTRN 9FT ADLT (ELECTROSURGICAL) ×1 IMPLANT
GLOVE BIOGEL PI IND STRL 7.0 (GLOVE) ×2 IMPLANT
GLOVE BIOGEL PI INDICATOR 7.0 (GLOVE) ×4
GLOVE ECLIPSE 6.5 STRL STRAW (GLOVE) ×3 IMPLANT
GOWN STRL REUS W/TWL LRG LVL3 (GOWN DISPOSABLE) ×6 IMPLANT
LOOP ANGLED CUTTING 22FR (CUTTING LOOP) IMPLANT
PACK VAGINAL MINOR WOMEN LF (CUSTOM PROCEDURE TRAY) ×3 IMPLANT
PAD OB MATERNITY 4.3X12.25 (PERSONAL CARE ITEMS) ×3 IMPLANT
SET TUBING HYSTEROSCOPY 2 NDL (TUBING) ×3 IMPLANT
TOWEL OR 17X24 6PK STRL BLUE (TOWEL DISPOSABLE) ×6 IMPLANT
TUBE HYSTEROSCOPY W Y-CONNECT (TUBING) ×3 IMPLANT
WATER STERILE IRR 1000ML POUR (IV SOLUTION) ×3 IMPLANT

## 2014-07-17 NOTE — Brief Op Note (Signed)
07/17/2014  3:22 PM  PATIENT:  [name]  58 y.o. female  PRE-OPERATIVE DIAGNOSIS:  Endometrial Thickening on ultrasound/CTscan  DIAGNOSIS:  endometrial thickening on ultrasound/CTscan PROCEDURE:  Diagnostic hysteroscopy, dilation and curettage  SURGEON:  Surgeon(s) and Role:    * Stein Windhorst Clint Bolder, MD - Primary  PHYSICIAN ASSISTANT:   ASSISTANTS: none   ANESTHESIA:   general FINDINGS; Thin endometrium, arcuate uterus, nl endocervical canal EBL:  Total I/O In: 800 [I.V.:800] Out: 100 [Urine:100]  BLOOD ADMINISTERED:none  DRAINS: none   LOCAL MEDICATIONS USED:  NONE  SPECIMEN:  Source of Specimen:  emc  DISPOSITION OF SPECIMEN:  PATHOLOGY  COUNTS:  YES  TOURNIQUET:  * No tourniquets in log *  DICTATION.Other Dictation: Dictation Number [id number]  PLAN OF CARE: Discharge to home after PACU  PATIENT DISPOSITION:  PACU - hemodynamically stable.   Delay start of Pharmacological VTE agent (>24hrs) due to surgical blood loss or risk of bleeding: no

## 2014-07-17 NOTE — Discharge Instructions (Signed)
CALL  IF TEMP>100.4, NOTHING PER VAGINA X 1WK, CALL IF SOAKING A MAXI  PAD EVERY HOUR OR MORE FREQUENTLYDISCHARGE INSTRUCTIONS: D&C / D&E °The following instructions have been prepared to help you care for yourself upon your return home. °  °Personal hygiene: °• Use sanitary pads for vaginal drainage, not tampons. °• Shower the day after your procedure. °• NO tub baths, pools or Jacuzzis for 2-3 weeks. °• Wipe front to back after using the bathroom. ° °Activity and limitations: °• Do NOT drive or operate any equipment for 24 hours. The effects of anesthesia are still present and drowsiness may result. °• Do NOT rest in bed all day. °• Walking is encouraged. °• Walk up and down stairs slowly. °• You may resume your normal activity in one to two days or as indicated by your physician. ° °Sexual activity: NO intercourse for at least 2 weeks after the procedure, or as indicated by your physician. ° °Diet: Eat a light meal as desired this evening. You may resume your usual diet tomorrow. ° °Return to work: You may resume your work activities in one to two days or as indicated by your doctor. ° °What to expect after your surgery: Expect to have vaginal bleeding/discharge for 2-3 days and spotting for up to 10 days. It is not unusual to have soreness for up to 1-2 weeks. You may have a slight burning sensation when you urinate for the first day. Mild cramps may continue for a couple of days. You may have a regular period in 2-6 weeks. ° °Call your doctor for any of the following: °• Excessive vaginal bleeding, saturating and changing one pad every hour. °• Inability to urinate 6 hours after discharge from hospital. °• Pain not relieved by pain medication. °• Fever of 100.4° F or greater. °• Unusual vaginal discharge or odor. ° ° Call for an appointment:  ° ° °Patient’s signature: ______________________ ° °Nurse’s signature ________________________ ° °Support person's signature_______________________ ° ° ° °

## 2014-07-17 NOTE — Transfer of Care (Signed)
Immediate Anesthesia Transfer of Care Note  Patient: [name]  Procedure(s) Performed: Procedure(s): DILATATION AND CURETTAGE /HYSTEROSCOPY (N/A)  Patient Location: PACU  Anesthesia Type:General  Level of Consciousness: awake, alert , oriented and patient cooperative  Airway & Oxygen Therapy: Patient Spontanous Breathing and Patient connected to nasal cannula oxygen  Post-op Assessment: Report given to PACU RN and Post -op Vital signs reviewed and stable  Post vital signs: Reviewed and stable  Complications: No apparent anesthesia complications

## 2014-07-17 NOTE — Anesthesia Postprocedure Evaluation (Signed)
  Anesthesia Post-op Note  Patient: [name]  Procedure(s) Performed: Procedure(s): DILATATION AND CURETTAGE /HYSTEROSCOPY (N/A)  Patient Location: PACU  Anesthesia Type:General  Level of Consciousness: awake, alert  and oriented  Airway and Oxygen Therapy: Patient Spontanous Breathing  Post-op Pain: none  Post-op Assessment: Post-op Vital signs reviewed, Patient's Cardiovascular Status Stable, Respiratory Function Stable, Patent Airway, No signs of Nausea or vomiting and Pain level controlled  Post-op Vital Signs: Reviewed and stable  Last Vitals:  Filed Vitals:   07/17/14 1500  BP: 114/56  Pulse: 72  Temp:   Resp: 20    Complications: No apparent anesthesia complications

## 2014-07-17 NOTE — H&P (Signed)
[name] is an 58 y.o. female. [name] presents for dx hysteroscopy, D&C due to thickened endometrium on CTscan and confirmed by sono hysterogram and ebx showed only scant atrophic endometrium however endometrium was 6.5 mm. Pt is therefore here for further evaluation  Pertinent Gynecological History: Menses: postmenopausal Menstrual History: Menarche age: n/a:  No LMP recorded. Patient is postmenopausal.    Past Medical History  Diagnosis Date  . Diverticulitis of large intestine with perforation 02/07/2012  . PONV (postoperative nausea and vomiting)   . Heart murmur     as a child   . GERD (gastroesophageal reflux disease)     no meds  . Hypothyroidism     hx of  15 yrs ago    Past Surgical History  Procedure Laterality Date  . Rotator cuff repair    . Tubal ligation    . Nose surgery    . Laparotomy  02/07/2012    Procedure: EXPLORATORY LAPAROTOMY;  Surgeon: Gayland Curry, MD,FACS;  Location: [place];  Service: General;  Laterality: Bilateral;  . Partial colectomy  02/07/2012    Procedure: PARTIAL COLECTOMY;  Surgeon: Gayland Curry, MD,FACS;  Location: [place];  Service: General;  Laterality: N/A;  . Colostomy  02/07/2012    Procedure: COLOSTOMY;  Surgeon: Gayland Curry, MD,FACS;  Location: [place];  Service: General;  Laterality: Left;  . Breast surgery      right benign breast lump removed   . Colostomy takedown  09/01/2012    Procedure: LAPAROSCOPIC COLOSTOMY TAKEDOWN;  Surgeon: Gayland Curry, MD,FACS;  Location: [facility];  Service: General;  Laterality: N/A;  Lap Assisted Colostomy Reversal   . Laparoscopic sigmoid colectomy  09/01/2012    Procedure: LAPAROSCOPIC SIGMOID COLECTOMY;  Surgeon: Gayland Curry, MD,FACS;  Location: [facility];  Service: General;;  . Flexible sigmoidoscopy  09/01/2012    Procedure: Beryle Quant;  Surgeon: Gayland Curry, MD,FACS;  Location: [facility];  Service: General;;    Family History  Problem Relation Age of Onset  . Diabetes Father   .  Hypertension Father     Social History:  reports that she has never smoked. She has never used smokeless tobacco. She reports that she does not drink alcohol or use illicit drugs.  Allergies:  Allergies  Allergen Reactions  . Contrast Media [Iodinated Diagnostic Agents] Hives and Shortness Of Breath    Skin flushed and felt burned  . Other Other (See Comments)    trinalin - for sinus infection - keeps me awake    Prescriptions prior to admission  Medication Sig Dispense Refill  . Cholecalciferol (VITAMIN D-3 PO) Take 1,000 Units by mouth daily. Uses Vitamin D drops      . Coenzyme Q10 (CO Q 10 PO) Take 1 tablet by mouth daily.      Marland Kitchen EPINEPHrine (EPIPEN) 0.3 mg/0.3 mL SOAJ injection Inject 0.3 mLs (0.3 mg total) into the muscle once as needed (allergic reaction then proceed to the ED).  2 Device  0  . MAGNESIUM-ZINC PO Take 1 tablet by mouth daily. Magnesium 400 mg & Zinc 15 mg      . Multiple Vitamins-Minerals (ALIVE WOMENS 50+) TABS Take 1 tablet by mouth daily. Gummy vitamin - 2 for adults      . OVER THE COUNTER MEDICATION Take 1 tablet by mouth daily. [address], Flaxseed, Oketo, Omega 3,6,9      . Probiotic Product (PROBIOTIC DAILY PO) Take 1 capsule by mouth daily.  Prescript-assist broad spectrum probiotic & prebiotic      . diphenhydrAMINE (BENADRYL) 25 MG tablet Take 25 mg by mouth at bedtime as needed (postnasal drop --> reflux).         Review of Systems  All other systems reviewed and are negative.   Blood pressure 135/73, pulse 87, temperature 98.2 F (36.8 C), temperature source Oral, resp. rate 18, SpO2 99.00%. Physical Exam  Constitutional: She is oriented to person, place, and time. She appears well-developed and well-nourished.  HENT:  Head: Atraumatic.  Eyes: EOM are normal.  Neck: Neck supple.  Cardiovascular: Regular rhythm.   Respiratory: Breath sounds normal.  GI: Soft.  Genitourinary: Vagina normal and uterus normal.  Musculoskeletal: She  exhibits no edema.  Neurological: She is alert and oriented to person, place, and time.  Skin: Skin is warm and dry.  Psychiatric: She has a normal mood and affect.  vulva atrophic Vagina pale mucosa Cervix parous Uterus small Adnexa no palp mass  No results found for this or any previous visit (from the past 24 hour(s)).  No results found.  Assessment/Plan: Thickened endometrium Postmenopausal pt P) dx hysteroscopy, D&C. Risk of surgery reviewed including infection, bleeding, injury to surrounding organ, uterine perforation and its mgmt, thermal injury fluid overload  [name] A 07/17/2014, 1:10 PM

## 2014-07-17 NOTE — Anesthesia Preprocedure Evaluation (Signed)
Anesthesia Evaluation  Patient identified by MRN, date of birth, ID band Patient awake    Reviewed: Allergy & Precautions, H&P , NPO status , Patient's Chart, lab work & pertinent test results  History of Anesthesia Complications (+) PONV and history of anesthetic complications  Airway Mallampati: II TM Distance: >3 FB     Dental no notable dental hx. (+) Teeth Intact, Chipped   Pulmonary neg pulmonary ROS,  breath sounds clear to auscultation  Pulmonary exam normal       Cardiovascular negative cardio ROS  + Valvular Problems/Murmurs Rhythm:Regular Rate:Normal     Neuro/Psych negative neurological ROS  negative psych ROS   GI/Hepatic Neg liver ROS, GERD-  Medicated and Controlled,Hx/o Diverticular disease   Endo/Other  Hypothyroidism Obesity  Renal/GU negative Renal ROS  negative genitourinary   Musculoskeletal negative musculoskeletal ROS (+)   Abdominal (+) + obese,   Peds  Hematology negative hematology ROS (+)   Anesthesia Other Findings   Reproductive/Obstetrics Endometrial thickening PMB                           Anesthesia Physical Anesthesia Plan  ASA: II  Anesthesia Plan: General   Post-op Pain Management:    Induction: Intravenous  Airway Management Planned: LMA  Additional Equipment:   Intra-op Plan:   Post-operative Plan: Extubation in OR  Informed Consent: I have reviewed the patients History and Physical, chart, labs and discussed the procedure including the risks, benefits and alternatives for the proposed anesthesia with the patient or authorized representative who has indicated his/her understanding and acceptance.   Dental advisory given  Plan Discussed with: CRNA, Anesthesiologist and Surgeon  Anesthesia Plan Comments:         Anesthesia Quick Evaluation

## 2014-07-18 ENCOUNTER — Encounter (HOSPITAL_COMMUNITY): Payer: Self-pay | Admitting: Obstetrics and Gynecology

## 2014-07-18 NOTE — Op Note (Signed)
[name], [name]              ACCOUNT NO.:  [id number]  MEDICAL RECORD NO.:  [id number]  LOCATION:  [facility]                          FACILITY:  [facility]  PHYSICIAN:  Servando Salina, M.D.DATE OF BIRTH:  1956/09/27  DATE OF PROCEDURE:  07/17/2014 DATE OF DISCHARGE:  07/17/2014                              OPERATIVE REPORT   PREOPERATIVE DIAGNOSIS:  Endometrial thickening on ultrasound.  PROCEDURES:  Diagnostic hysteroscopy, dilation and curettage.  POSTOPERATIVE DIAGNOSIS:  Endometrial thickening on ultrasound.  ANESTHESIA:  General.  SURGEON:  Servando Salina, MD  ASSISTANT:  None.  DESCRIPTION OF PROCEDURE:  Under adequate general anesthesia, the patient was placed in the dorsal lithotomy position.  She was sterilely prepped and draped in the usual fashion.  The bladder was catheterized for moderate amount of urine.  Examination under anesthesia revealed a small uterus.  No adnexal masses could be appreciated.  Bivalve speculum placed in the vagina.  Single-tooth tenaculum was placed on the anterior lip of the cervix.  The cervix was then carefully dilated up to a #23 Pratt dilator.  Hysteroscopy was introduced into the uterine cavity.  Both tubal ostia were sclerosed.  There was evidence of suggestion of arcuate area.  No area of polyps or focal thickening was identified. The endocervical canal also without any lesion.  At that point, the hysteroscope was removed.  The cavity was then gently curetted for scant amount of tissue.  Hysteroscope was then reinserted.  The cavity was well sampled.  At that point, the procedure was felt to be complete, at which time all instruments were removed from the vagina.  SPECIMEN:  Labeled endometrial curetting was sent to Pathology.  ESTIMATED BLOOD LOSS:  Minimal.  FLUID DEFICIT:  85 ml.  COMPLICATION:  None.  The patient tolerated the procedure well, was transferred to recovery room in stable condition.     Servando Salina, M.D.     [place]/MEDQ  D:  07/17/2014  T:  07/18/2014  Job:  [id number]

## 2014-07-23 ENCOUNTER — Encounter: Payer: Self-pay | Admitting: Family Medicine

## 2014-07-23 ENCOUNTER — Ambulatory Visit (INDEPENDENT_AMBULATORY_CARE_PROVIDER_SITE_OTHER): Payer: BC Managed Care – PPO | Admitting: Family Medicine

## 2014-07-23 VITALS — BP 126/78 | HR 78 | Temp 98.0°F | Resp 12 | Ht 61.0 in | Wt 174.0 lb

## 2014-07-23 DIAGNOSIS — F341 Dysthymic disorder: Secondary | ICD-10-CM

## 2014-07-23 DIAGNOSIS — R739 Hyperglycemia, unspecified: Secondary | ICD-10-CM

## 2014-07-23 DIAGNOSIS — Z130 Encounter for screening for diseases of the blood and blood-forming organs and certain disorders involving the immune mechanism: Secondary | ICD-10-CM

## 2014-07-23 DIAGNOSIS — Z13228 Encounter for screening for other metabolic disorders: Secondary | ICD-10-CM

## 2014-07-23 DIAGNOSIS — Z1321 Encounter for screening for nutritional disorder: Secondary | ICD-10-CM

## 2014-07-23 DIAGNOSIS — R5383 Other fatigue: Secondary | ICD-10-CM

## 2014-07-23 DIAGNOSIS — R7309 Other abnormal glucose: Secondary | ICD-10-CM

## 2014-07-23 DIAGNOSIS — F32A Depression, unspecified: Secondary | ICD-10-CM

## 2014-07-23 DIAGNOSIS — F419 Anxiety disorder, unspecified: Principal | ICD-10-CM

## 2014-07-23 DIAGNOSIS — Z1329 Encounter for screening for other suspected endocrine disorder: Secondary | ICD-10-CM

## 2014-07-23 DIAGNOSIS — E079 Disorder of thyroid, unspecified: Secondary | ICD-10-CM

## 2014-07-23 DIAGNOSIS — Z23 Encounter for immunization: Secondary | ICD-10-CM

## 2014-07-23 DIAGNOSIS — F329 Major depressive disorder, single episode, unspecified: Secondary | ICD-10-CM

## 2014-07-23 DIAGNOSIS — R5381 Other malaise: Secondary | ICD-10-CM

## 2014-07-23 LAB — HEMOGLOBIN A1C
Hgb A1c MFr Bld: 5.9 % — ABNORMAL HIGH (ref <5.7)
Mean Plasma Glucose: 123 mg/dL — ABNORMAL HIGH (ref <117)

## 2014-07-23 LAB — COMPREHENSIVE METABOLIC PANEL
ALT: 25 U/L (ref 0–35)
AST: 16 U/L (ref 0–37)
Albumin: 4.2 g/dL (ref 3.5–5.2)
Alkaline Phosphatase: 63 U/L (ref 39–117)
BUN: 13 mg/dL (ref 6–23)
CALCIUM: 9.4 mg/dL (ref 8.4–10.5)
CHLORIDE: 106 meq/L (ref 96–112)
CO2: 25 meq/L (ref 19–32)
CREATININE: 0.71 mg/dL (ref 0.50–1.10)
GLUCOSE: 101 mg/dL — AB (ref 70–99)
Potassium: 4.4 mEq/L (ref 3.5–5.3)
Sodium: 140 mEq/L (ref 135–145)
Total Bilirubin: 0.5 mg/dL (ref 0.2–1.2)
Total Protein: 6.6 g/dL (ref 6.0–8.3)

## 2014-07-23 LAB — VITAMIN B12: Vitamin B-12: 467 pg/mL (ref 211–911)

## 2014-07-23 LAB — T3, FREE: T3, Free: 3 pg/mL (ref 2.3–4.2)

## 2014-07-23 LAB — T4, FREE: FREE T4: 1.3 ng/dL (ref 0.80–1.80)

## 2014-07-23 LAB — TSH: TSH: 2.357 u[IU]/mL (ref 0.350–4.500)

## 2014-07-23 MED ORDER — ESCITALOPRAM OXALATE 5 MG PO TABS: 5.0000 mg | ORAL_TABLET | Freq: Every day | ORAL | Status: DC

## 2014-07-23 NOTE — Patient Instructions (Addendum)
We will call with lab results Start the lexapro 5mg  at bedtime  F/U 6 weeks

## 2014-07-23 NOTE — Progress Notes (Signed)
Patient ID: [name], female   DOB: May 10, 1956, 58 y.o.   MRN: [id number]   Subjective:    Patient ID: [name], female    DOB: 1955-11-16, 58 y.o.   MRN: [id number]  Patient presents for Depression and Labs  patient here with concern for her depression and anxiety. She will listen her new but over the past month or so she's had increased tearfulness. Ability not sleeping well she feels impulsive at times feel like she can get anything right and has very low self-esteem. She states the symptoms started with all the workup for her abdominal pain and pelvic pain as well as her multiple allergies symptoms that she been having. She has seen 2 different allergy specialist and nothing particular has resolved her problems with her stuffiness and other allergy like symptoms with foods. She also still her GYN and had pelvic workup secondary to the ongoing pain there was concern for thickened endometrium therefore she did have history sonogram which was quite ankle for her and then she had a biopsy done all this did come back normal to get her mood has not improved despite knowing that they did not find anything wrong. In the past she was on Effexor for about 6 months this did make her feel like a zombie but she took it anyway she had difficulty over the passing of her mother subsequently thereafter her father passed away then she began having her own medical problems. He does request some blood work be done her gynecologist recommended that she have an A1c as one of her sugar levels were elevated. She also has a remote history of a thyroid disorder at one point she was on Synthroid but stopped the medication after she was "overdose" by the pharmacy    Review Of Systems:  GEN-+ fatigue, fever, weight loss,weakness, recent illness HEENT- denies eye drainage, change in vision, nasal discharge, CVS- denies chest pain, palpitations RESP- denies SOB, cough, wheeze ABD- denies N/V, change in stools,  abd pain GU- denies dysuria, hematuria, dribbling, incontinence MSK- denies joint pain, muscle aches, injury Neuro- denies headache, dizziness, syncope, seizure activity       Objective:    BP 126/78  Pulse 78  Temp(Src) 98 F (36.7 C) (Oral)  Resp 12  Ht 5\' 1"  (1.549 m)  Wt 174 lb (78.926 kg)  BMI 32.89 kg/m2 GEN- NAD, alert and oriented x3 HEENT- PERRL, EOMI, non injected sclera, pink conjunctiva, MMM, oropharynx clear Neck- Supple, no thyromegaly CVS- RRR, no murmur RESP-CTAB Psych- very tearful, depressed appearing, no SI, normal speech, well grommed EXT- No edema Pulses- Radial 2+        Assessment & Plan:      Problem List Items Addressed This Visit   None    Visit Diagnoses   Anxiety and depression    -  Primary    Start lexapro 5mg , contracted for saftey, family is supportive    Other malaise and fatigue        Relevant Orders       Comprehensive metabolic panel       Vitamin B12       Vitamin D, 25-hydroxy    Thyroid disorder        Recheck TFT    Relevant Orders       TSH       T3, free       T4, free    Encounter for vitamin deficiency screening  Relevant Orders       Vitamin D, 25-hydroxy    Blood glucose elevated        Check A1C    Relevant Orders       Hemoglobin A1c    Need for prophylactic vaccination and inoculation against influenza           Note: This dictation was prepared with Dragon dictation along with smaller phrase technology. Any transcriptional errors that result from this process are unintentional.

## 2014-07-24 LAB — VITAMIN D 25 HYDROXY (VIT D DEFICIENCY, FRACTURES): Vit D, 25-Hydroxy: 45 ng/mL (ref 30–89)

## 2014-10-14 ENCOUNTER — Ambulatory Visit (INDEPENDENT_AMBULATORY_CARE_PROVIDER_SITE_OTHER): Payer: BC Managed Care – PPO | Admitting: Family Medicine

## 2014-10-14 ENCOUNTER — Encounter: Payer: Self-pay | Admitting: Family Medicine

## 2014-10-14 VITALS — BP 128/74 | HR 68 | Temp 98.3°F | Resp 14 | Ht 61.5 in | Wt 167.0 lb

## 2014-10-14 DIAGNOSIS — I499 Cardiac arrhythmia, unspecified: Secondary | ICD-10-CM

## 2014-10-14 DIAGNOSIS — F418 Other specified anxiety disorders: Secondary | ICD-10-CM

## 2014-10-14 DIAGNOSIS — R002 Palpitations: Secondary | ICD-10-CM

## 2014-10-14 DIAGNOSIS — R9431 Abnormal electrocardiogram [ECG] [EKG]: Secondary | ICD-10-CM

## 2014-10-14 LAB — EKG 12-LEAD

## 2014-10-14 MED ORDER — ESCITALOPRAM OXALATE 10 MG PO TABS: 10.0000 mg | ORAL_TABLET | Freq: Every day | ORAL | Status: DC

## 2014-10-14 NOTE — Assessment & Plan Note (Signed)
EKG  Plan to place 24 hour holter monitor Recent labs normal No other cardiac risk factors, ? Related to anxiety

## 2014-10-14 NOTE — Assessment & Plan Note (Signed)
Increase lexapro to 10mg  once a day

## 2014-10-14 NOTE — Assessment & Plan Note (Signed)
ekg possible old infarct no acute changes, compared to sept, send to  cardiology if nothing on holter and symptoms persist

## 2014-10-14 NOTE — Progress Notes (Signed)
Patient ID: [name], female   DOB: 1956/05/26, 58 y.o.   MRN: [id number]   Subjective:    Patient ID: [name], female    DOB: 1956-04-15, 58 y.o.   MRN: [id number]  Patient presents for Medication Review patient here to follow-up medications. She was seen in September secondary to some decreased mood depressed symptoms poor sleep and frequent crying episodes. I started her on Lexapro 5 mg. She states she does not feel sad anymore her sleep has improved about 50% but she still feels tired all the time and lacks interest in things. She was concerned as well for possible arrhythmia and her heart she noticed over the past 6 weeks or so and when she goes to lie down she feels like her heart is fluttering she states she has had in the past but that was coming for her stomach but now she actually feels it in her chest. Most 2 times when she stepping up to get on her bed and then she lies backwards. She does not have any difficulties doing her activities during the day and tries to stay quite active. She denies any chest pain of note she did have her labs done back in September which were all normal including her B-12 levels as well as her thyroid function studies.    Review Of Systems:  GEN- denies fatigue, fever, weight loss,weakness, recent illness HEENT- denies eye drainage, change in vision, nasal discharge, CVS- denies chest pain, +palpitations RESP- denies SOB, cough, wheeze ABD- denies N/V, change in stools, abd pain GU- denies dysuria, hematuria, dribbling, incontinence MSK- denies joint pain, muscle aches, injury Neuro- denies headache, dizziness, syncope, seizure activity       Objective:    BP 128/74 mmHg  Pulse 68  Temp(Src) 98.3 F (36.8 C) (Oral)  Resp 14  Ht 5' 1.5" (1.562 m)  Wt 167 lb (75.751 kg)  BMI 31.05 kg/m2 GEN- NAD, alert and oriented x3 HEENT- PERRL, EOMI, non injected sclera, pink conjunctiva, MMM, oropharynx clear Neck- Supple, no thyromegaly, no  JVD CVS- RRR, no murmur RESP-CTAB EXT- No edema Pulses- Radial 2+ Psych- Normal affect and mood  EKG-NSR   Compared to Sept 2015, NSR, 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 Any transcriptional errors that result from this process are unintentional.

## 2014-10-14 NOTE — Patient Instructions (Signed)
We will call with monitor results Lexapro increased to 10mg  F/U pending results of heart monitor

## 2014-10-21 ENCOUNTER — Telehealth: Payer: Self-pay | Admitting: Family Medicine

## 2014-10-21 DIAGNOSIS — R002 Palpitations: Secondary | ICD-10-CM

## 2014-10-21 DIAGNOSIS — I493 Ventricular premature depolarization: Secondary | ICD-10-CM

## 2014-10-21 NOTE — Telephone Encounter (Signed)
Call placed to patient.   Reports that she has been taking increased dosage of Lexapro.   Reports that she has been having some nausea in the morning after taking Lexapro at night. Also states that she has some headaches in the morning.   Reports that since she has been taking medication, she has noted some dark black looking stools. Denies having any abdominal pain or cramping.   States that she did not take the Lexapro 10mg  on 10/20/2014. Reports that nausea and headache were improved this morning.   Also states that her stools are not as dark since she did not take Lexapo last night.   Reports that she has (4) Lexapro 5mg  if needed.   MD please advise.

## 2014-10-21 NOTE — Telephone Encounter (Signed)
Call placed to patient and patient made aware.   Patient will call back on 10/28/2014 to advise of how she is feeling at that time.

## 2014-10-21 NOTE — Telephone Encounter (Addendum)
Patient is calling to say that she prescribed lexapo and is now having black stool, blurred vision, and nausea would like to know should she keep taking this, its only happened since the dose was increased  [phone] also is not sleeping well, and did not take last night

## 2014-10-21 NOTE — Telephone Encounter (Signed)
I have not found lexapro to cause the dark stools before, it can cause a little headache especially with change in the dose, she can drop back to 5mg  for next 4 days then stop. I want to give a week out of her system to see if all her symptoms clear, then we will need to try something else Effexor 37.5mg  XR once a day

## 2014-10-23 NOTE — Telephone Encounter (Signed)
Call placed to patient. LMTRC.  

## 2014-10-23 NOTE — Telephone Encounter (Signed)
Call pt her monitor shows some ectopic beats PVC, and times when her HR would speed up I want her to see Cardiology for the episodes,

## 2014-10-28 NOTE — Telephone Encounter (Signed)
Cal placed to patient.   Patient made aware of Holter Monitor results.   Reports that since she has stopped lexapro, her heart rate has felt much more normal.   States that she continues to have minor stomach cramps and HA, but she is feeling improved.   MD to be made aware.

## 2014-10-29 ENCOUNTER — Encounter: Payer: Self-pay | Admitting: Family Medicine

## 2014-11-07 ENCOUNTER — Ambulatory Visit (INDEPENDENT_AMBULATORY_CARE_PROVIDER_SITE_OTHER): Payer: BC Managed Care – PPO | Admitting: Cardiovascular Disease

## 2014-11-07 ENCOUNTER — Encounter: Payer: Self-pay | Admitting: Cardiovascular Disease

## 2014-11-07 VITALS — BP 120/80 | HR 70 | Ht 61.0 in | Wt 176.0 lb

## 2014-11-07 DIAGNOSIS — R002 Palpitations: Secondary | ICD-10-CM

## 2014-11-07 DIAGNOSIS — R9431 Abnormal electrocardiogram [ECG] [EKG]: Secondary | ICD-10-CM

## 2014-11-07 NOTE — Assessment & Plan Note (Signed)
[name] for [facility]ations. She has no cardiac risk factors. She noticed the palpitations when she was put on Lexapro.a Holter monitor showed occasional PVCs and PACs. Her twelve-lead EKG shows poor R-wave progression suggesting old anterior wall myocardial infarction though she's never had this clinically and specifically denies any symptoms. I'm going to get a 2-D echo to look at LV function and we'll see her back when necessary

## 2014-11-07 NOTE — Progress Notes (Signed)
11/07/2014 [name]   Jun 30, 1956  [id number]  Primary Physician Odette Fraction, MD Primary Cardiologist: Lorretta Harp MD Renae Gloss   HPI:  [name] is a 59 year old moderately overweight married Caucasian female mother of 2 children, grandmother and one grandchild is accompanied by her daughter Kieth Brightly today. She was sent over by Dr. Buelah Manis from [facility] family practice for evaluation of palpitations. She has no cardiovascular risk factors. She was put on Lexapro and noticed palpitations. A Holter monitor showed scattered PACs and PVCs. A 12-lead cardiogram showed poor R-wave progression suggesting the possibility of an old anterior wall marked myocardial infarction though she denies any symptoms of this.   Current Outpatient Prescriptions  Medication Sig Dispense Refill  . EPINEPHrine (EPIPEN) 0.3 mg/0.3 mL SOAJ injection Inject 0.3 mLs (0.3 mg total) into the muscle once as needed (allergic reaction then proceed to the ED). 2 Device 0  . fluticasone (FLONASE) 50 MCG/ACT nasal spray Place 1 spray into both nostrils as needed for allergies or rhinitis.    . Probiotic Product (PROBIOTIC DAILY PO) Take 1 capsule by mouth daily. Prescript-assist broad spectrum probiotic & prebiotic     No current facility-administered medications for this visit.    Allergies  Allergen Reactions  . Contrast Media [Iodinated Diagnostic Agents] Hives and Shortness Of Breath    Skin flushed and felt burned  . Other Other (See Comments)    trinalin - for sinus infection - keeps me awake    History   Social History  . Marital Status: Married    Spouse Name: N/A    Number of Children: N/A  . Years of Education: N/A   Occupational History  . Not on file.   Social History Main Topics  . Smoking status: Never Smoker   . Smokeless tobacco: Never Used  . Alcohol Use: No  . Drug Use: No  . Sexual Activity: Not on file   Other Topics Concern  . Not on file   Social  History Narrative     Review of Systems: General: negative for chills, fever, night sweats or weight changes.  Cardiovascular: negative for chest pain, dyspnea on exertion, edema, orthopnea, palpitations, paroxysmal nocturnal dyspnea or shortness of breath Dermatological: negative for rash Respiratory: negative for cough or wheezing Urologic: negative for hematuria Abdominal: negative for nausea, vomiting, diarrhea, bright red blood per rectum, melena, or hematemesis Neurologic: negative for visual changes, syncope, or dizziness All other systems reviewed and are otherwise negative except as noted above.    Blood pressure 120/80, pulse 70, height 5\' 1"  (1.549 m), weight 176 lb (79.833 kg).  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 I personally reviewed this EKG  ASSESSMENT AND PLAN:   Palpitations This patient was [facility] for [facility]ations. She has no cardiac risk factors. She noticed the palpitations when she was put on Lexapro.a Holter monitor showed occasional PVCs and PACs. Her twelve-lead EKG shows poor R-wave progression suggesting old anterior wall myocardial infarction though she's never  had this clinically and specifically denies any symptoms. I'm going to get a 2-D echo to look at LV function and we'll see her back when necessary      Lorretta Harp MD [facility], [facility] 11/07/2014 4:55 PM

## 2014-11-07 NOTE — Patient Instructions (Signed)
  We will see you back in follow up only as needed.   Dr Gwenlyn Found has ordered:  Echocardiogram. Echocardiography is a painless test that uses sound waves to create images of your heart. It provides your doctor with information about the size and shape of your heart and how well your heart's chambers and valves are working. This procedure takes approximately one hour. There are no restrictions for this procedure.

## 2014-11-14 ENCOUNTER — Ambulatory Visit (HOSPITAL_COMMUNITY): Payer: BC Managed Care – PPO

## 2014-11-14 ENCOUNTER — Encounter: Payer: Self-pay | Admitting: Family Medicine

## 2014-11-21 ENCOUNTER — Telehealth: Payer: Self-pay | Admitting: Cardiovascular Disease

## 2014-11-21 ENCOUNTER — Ambulatory Visit (HOSPITAL_COMMUNITY)
Admission: RE | Admit: 2014-11-21 | Discharge: 2014-11-21 | Disposition: A | Discharge status: Home / Self Care | Payer: BC Managed Care – PPO | Source: Ambulatory Visit | Attending: Cardiology | Admitting: Cardiology

## 2014-11-21 DIAGNOSIS — R002 Palpitations: Secondary | ICD-10-CM | POA: Insufficient documentation

## 2014-11-21 DIAGNOSIS — R9431 Abnormal electrocardiogram [ECG] [EKG]: Secondary | ICD-10-CM

## 2014-11-21 NOTE — Progress Notes (Signed)
2D Echo Performed 11/21/2014    Marygrace Drought, RCS

## 2014-11-26 ENCOUNTER — Encounter: Payer: Self-pay | Admitting: *Deleted

## 2014-11-27 NOTE — Telephone Encounter (Signed)
Closed encounter °

## 2015-07-17 ENCOUNTER — Ambulatory Visit
Admission: RE | Admit: 2015-07-17 | Discharge: 2015-07-17 | Disposition: A | Discharge status: Home / Self Care | Payer: BC Managed Care – PPO | Source: Ambulatory Visit | Attending: Physician Assistant | Admitting: Physician Assistant

## 2015-07-17 ENCOUNTER — Encounter: Payer: Self-pay | Admitting: Physician Assistant

## 2015-07-17 ENCOUNTER — Ambulatory Visit (INDEPENDENT_AMBULATORY_CARE_PROVIDER_SITE_OTHER): Payer: BC Managed Care – PPO | Admitting: Physician Assistant

## 2015-07-17 VITALS — BP 118/70 | HR 76 | Temp 98.6°F | Resp 18 | Wt 176.0 lb

## 2015-07-17 DIAGNOSIS — M5432 Sciatica, left side: Secondary | ICD-10-CM

## 2015-07-17 DIAGNOSIS — B351 Tinea unguium: Secondary | ICD-10-CM

## 2015-07-17 DIAGNOSIS — M5431 Sciatica, right side: Secondary | ICD-10-CM

## 2015-07-17 DIAGNOSIS — Z23 Encounter for immunization: Secondary | ICD-10-CM

## 2015-07-17 DIAGNOSIS — T148 Other injury of unspecified body region: Secondary | ICD-10-CM

## 2015-07-17 DIAGNOSIS — R202 Paresthesia of skin: Secondary | ICD-10-CM

## 2015-07-17 DIAGNOSIS — W57XXXA Bitten or stung by nonvenomous insect and other nonvenomous arthropods, initial encounter: Secondary | ICD-10-CM | POA: Diagnosis not present

## 2015-07-17 NOTE — Progress Notes (Signed)
Patient ID: [name] MRN: [id number], DOB: September 09, 1956, 59 y.o. Date of Encounter: @DATE @  Chief Complaint:  Chief Complaint  Patient presents with  . pain in legs    ? sciatica, ? tick bites and c/o toe nail fungus  . bulging on rt side of abdomen    HPI: 59 y.o. year old white female  presents with multiple issues she wants to address:  Says that she has been feeling paresthesias in both of her calves at times. Says it only happens when she is sitting. Never feels that when she is standing or walking. Describes it as somewhat burning sensation but also tingling sensation.  Also states that she has found 2 ticks-- one on her left chest and one on her lower abdomen. Has had no rash. Has had no fever. Has had no headache. No myalgias.  Also states that her husband had same type of paresthesias in the past and his doctor gave him two  B12 shots and the symptoms resolved. Says that her paresthesias are only in the calves. No paresthesias in the feet.  She also has a bottle of medication with her that she is requesting me prescribe. Says that this was prescribed by Dr. Nevada Crane dermatologist to her husband for toenail fungus in the past. She is now having problems with toenail fungus and is wanting prescription for this same treatment.  Initially she had said that she had been feeling no pain in her low back or sciatic region. However, while sitting in the chair in our exam room, she says that this chair is harder than she is used to sitting on--- and as sitting on this chair, she is feeling sensation in bilateral sciatic notches--- this is the area that she points to bilaterally as area that she is feeling burning discomfort in currently.  Says that she has felt no paresthesias down the entire leg--none in the thighs--- only down in the calves. Says that she has felt no other types of pain numbness or tingling down the entire leg.    Past Medical History  Diagnosis Date  .  Diverticulitis of large intestine with perforation 02/07/2012  . PONV (postoperative nausea and vomiting)   . Heart murmur     as a child   . GERD (gastroesophageal reflux disease)     no meds  . Hypothyroidism     hx of  15 yrs ago  . Palpitations     occasional PVCs and PACs on Holter monitor  . Abnormal EKG      Home Meds: Outpatient Prescriptions Prior to Visit  Medication Sig Dispense Refill  . EPINEPHrine (EPIPEN) 0.3 mg/0.3 mL SOAJ injection Inject 0.3 mLs (0.3 mg total) into the muscle once as needed (allergic reaction then proceed to the ED). 2 Device 0  . fluticasone (FLONASE) 50 MCG/ACT nasal spray Place 1 spray into both nostrils as needed for allergies or rhinitis.    . Probiotic Product (PROBIOTIC DAILY PO) Take 1 capsule by mouth daily. Prescript-assist broad spectrum probiotic & prebiotic     No facility-administered medications prior to visit.    Allergies:  Allergies  Allergen Reactions  . Contrast Media [Iodinated Diagnostic Agents] Hives and Shortness Of Breath    Skin flushed and felt burned  . Other Other (See Comments)    trinalin - for sinus infection - keeps me awake    Social History   Social History  . Marital Status: Married    Spouse Name: N/A  .  Number of Children: N/A  . Years of Education: N/A   Occupational History  . Not on file.   Social History Main Topics  . Smoking status: Never Smoker   . Smokeless tobacco: Never Used  . Alcohol Use: No  . Drug Use: No  . Sexual Activity: Not on file   Other Topics Concern  . Not on file   Social History Narrative    Family History  Problem Relation Age of Onset  . Diabetes Father   . Hypertension Father      Review of Systems:  See HPI for pertinent ROS. All other ROS negative.    Physical Exam: Blood pressure 118/70, pulse 76, temperature 98.6 F (37 C), temperature source Oral, resp. rate 18, weight 176 lb (79.833 kg)., Body mass index is 33.27 kg/(m^2). General: WNWD WF.  Appears in no acute distress. Neck: Supple. No thyromegaly. No lymphadenopathy. Lungs: Clear bilaterally to auscultation without wheezes, rales, or rhonchi. Breathing is unlabored. Heart: RRR with S1 S2. No murmurs, rubs, or gallops. Musculoskeletal:  Strength and tone normal for age. Has it of tenderness with palpation of sciatic notches bilaterally. 2+ patella reflexes symmetric bilaterally. Extremities/Skin: Warm and dry. No edema. No rashes or suspicious lesions.All 10 toenails are thick, opague. Neuro: Alert and oriented X 3. Moves all extremities spontaneously. Gait is normal. CNII-XII grossly in tact. Psych:  Responds to questions appropriately with a normal affect.     ASSESSMENT AND PLAN:  59 y.o. year old female with  1. Paresthesias  Will check B12 level as well as Lyme and [facility] spotted fever titers. Also will obtain x-ray lumbar spine. Will follow-up with her and once these results are obtained. - Vitamin B12 - B. burgdorfi antibodies by WB - Rocky mtn spotted fvr abs pnl(IgG+IgM) - DG Lumbar Spine Complete; Future  2. Tick bite - B. burgdorfi antibodies by WB - Rocky mtn spotted fvr abs pnl(IgG+IgM)  3. Need for prophylactic vaccination and inoculation against influenza - Flu Vaccine QUAD 36+ mos PF IM (Fluarix & Fluzone Quad PF)  4. Bilateral sciatica - DG Lumbar Spine Complete; Future  5. Onychomycosis Handwritten prescription given for terbinafine drops. This is to be filled at [facility] where she says that they can compound this mixture.   Marin Olp [place], [place], [facility] 07/17/2015 4:42 PM

## 2015-07-18 LAB — LYME ABY, WSTRN BLT IGG & IGM W/BANDS

## 2015-07-18 LAB — VITAMIN B12: Vitamin B-12: 526 pg/mL (ref 211–911)

## 2015-07-18 LAB — ROCKY MTN SPOTTED FVR ABS PNL(IGG+IGM)
RMSF IGM: 0.57 IV
RMSF IgG: 0.08 IV

## 2015-07-21 LAB — LYME AB/WESTERN BLOT REFLEX: B BURGDORFERI AB IGG+ IGM: 0.24 {ISR}

## 2015-07-25 ENCOUNTER — Other Ambulatory Visit: Payer: Self-pay | Admitting: Family Medicine

## 2015-07-25 MED ORDER — MELOXICAM 7.5 MG PO TABS: 7.5000 mg | ORAL_TABLET | Freq: Every day | ORAL | Status: DC

## 2015-07-28 ENCOUNTER — Other Ambulatory Visit: Payer: Self-pay | Admitting: Family Medicine

## 2015-07-28 NOTE — Telephone Encounter (Signed)
Medication refilled per protocol. 

## 2015-08-05 IMAGING — US US TRANSVAGINAL NON-OB
1 series · 13 of 25 positions shown · non-contrast
Comparison: CT of the abdomen and pelvis on 09/27/2012

CLINICAL DATA: Pelvic pain.  Postmenopausal.

EXAM:
TRANSABDOMINAL AND TRANSVAGINAL ULTRASOUND OF PELVIS
TECHNIQUE: Both transabdominal and transvaginal ultrasound examinations of the
pelvis were performed. Transabdominal technique was performed for
global imaging of the pelvis including uterus, ovaries, adnexal
regions, and pelvic cul-de-sac. It was necessary to proceed with
endovaginal exam following the transabdominal exam to visualize the
endometrium and adnexal regions.

[Series 1: us transvaginal non-ob · 0.21mm/px · 13 of 66 slices shown]
[im 1/66]
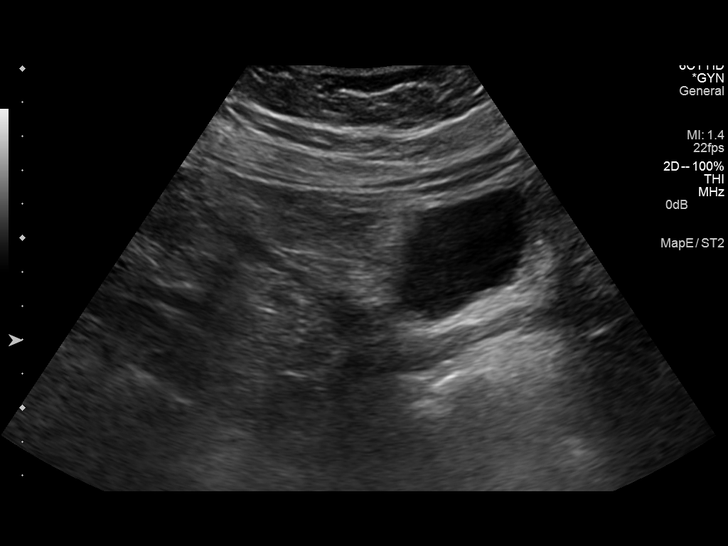
[im 6/66]
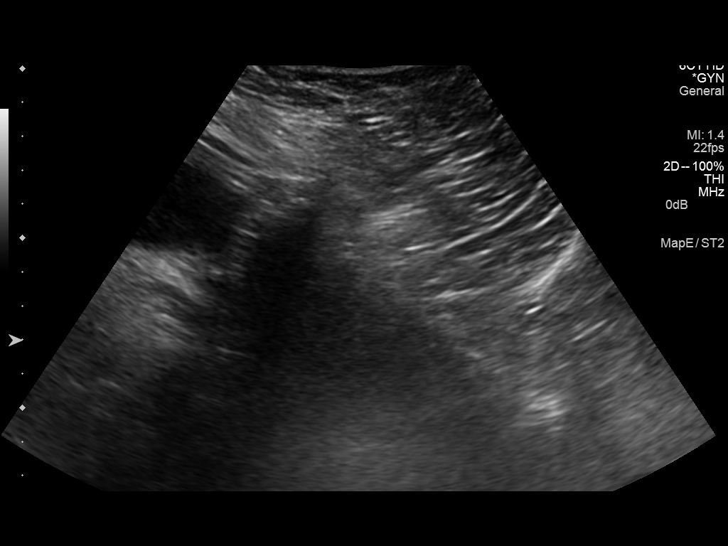
[im 11/66]
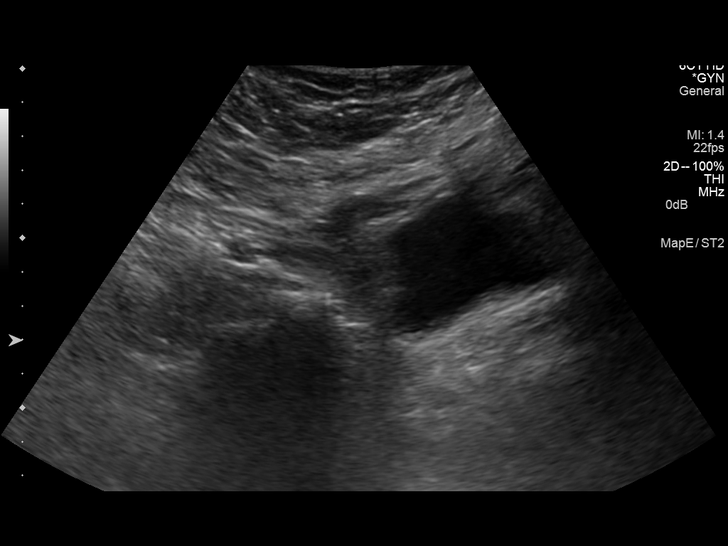
[im 17/66]
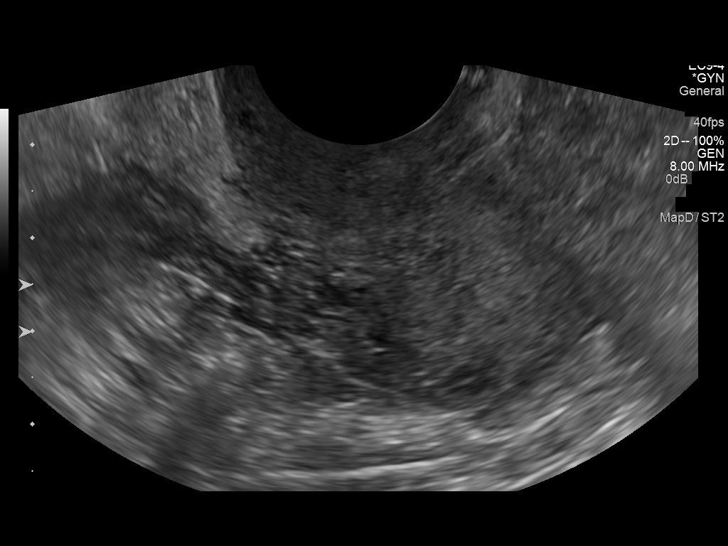
[im 22/66]
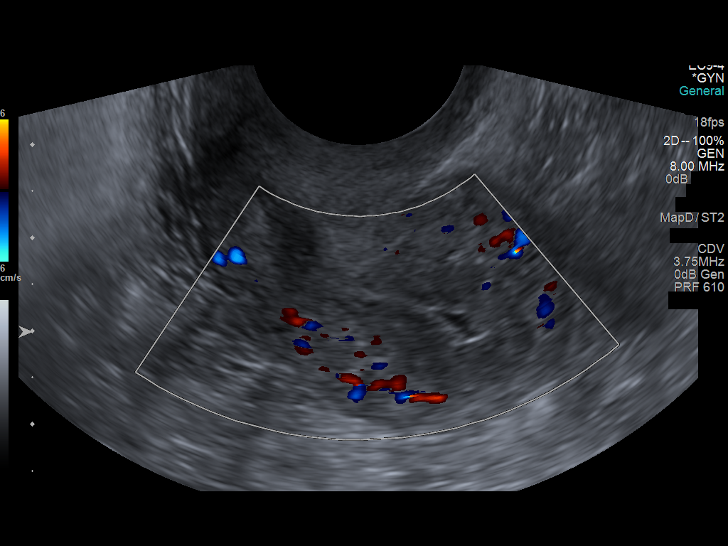
[im 28/66]
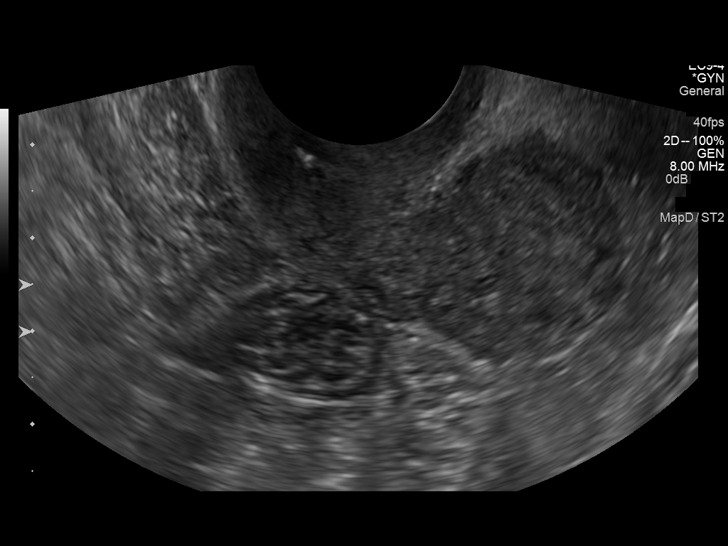
[im 33/66]
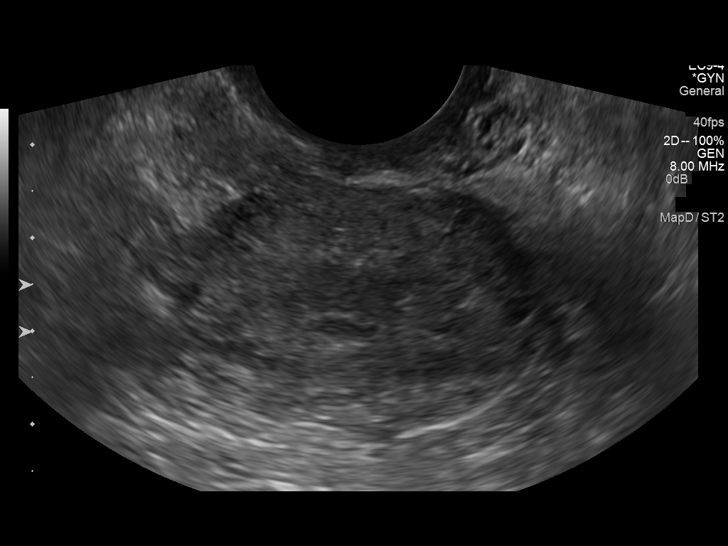
[im 38/66]
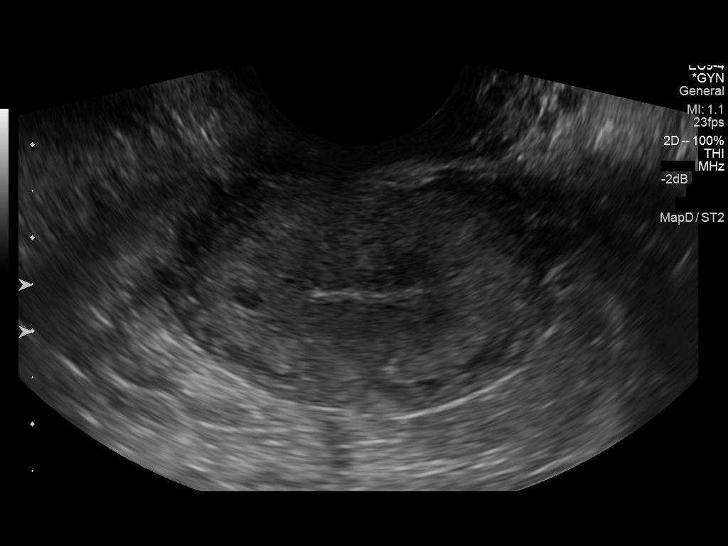
[im 44/66]
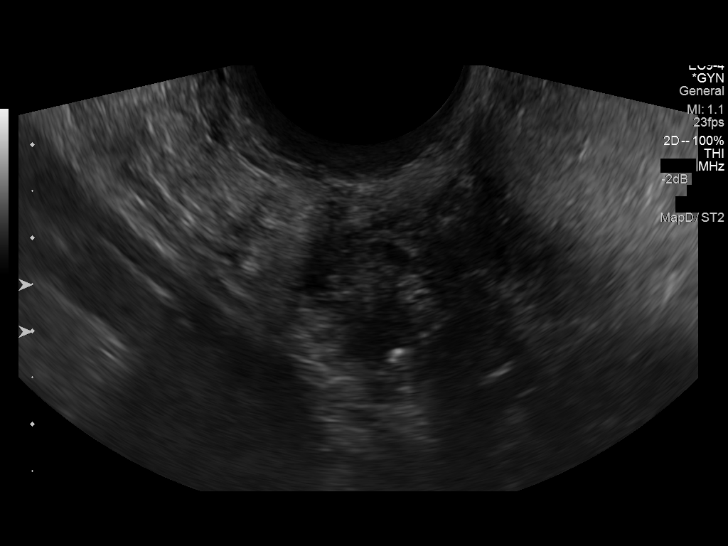
[im 49/66]
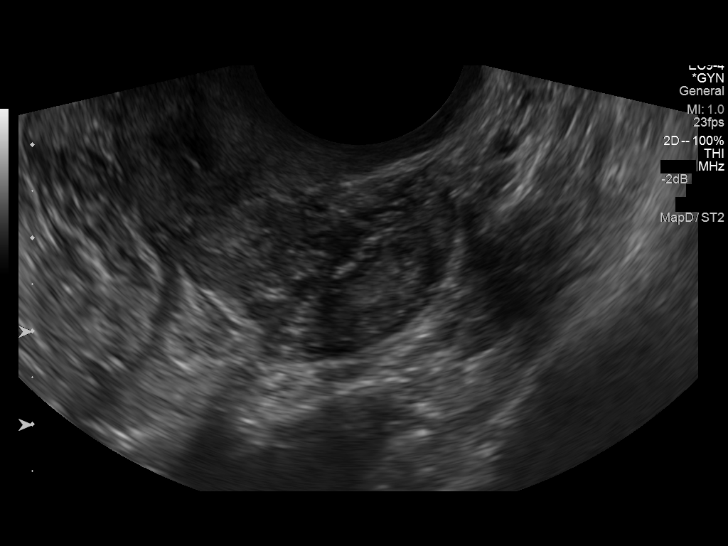
[im 55/66]
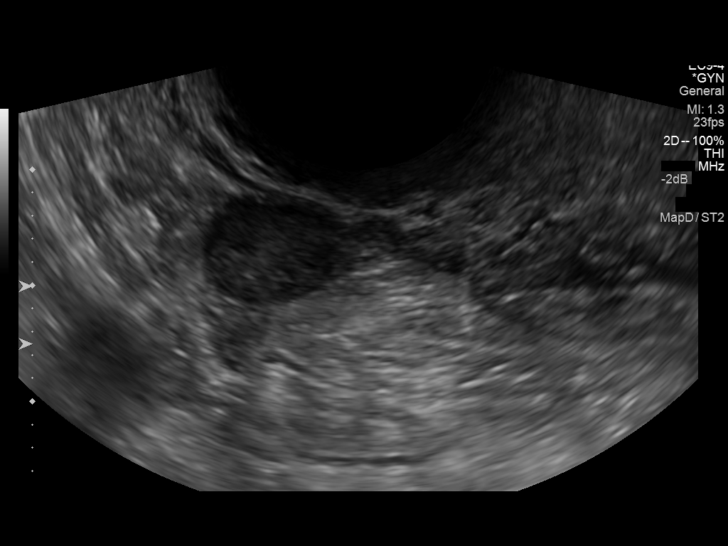
[im 60/66]
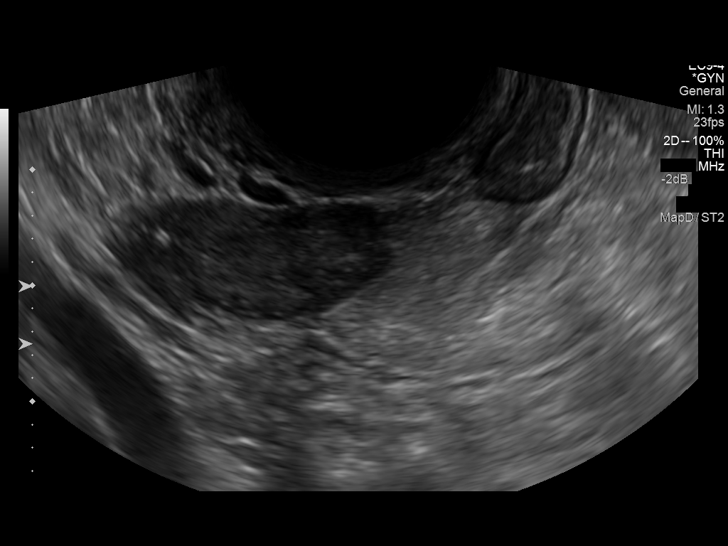
[im 66/66]
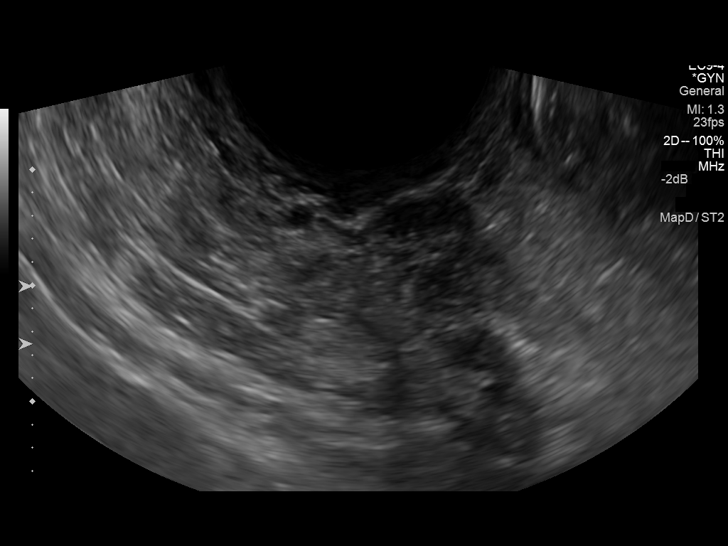

[13 of 25 positions shown; findings below may reference images not displayed]

FINDINGS: Uterus

Measurements: 4.4 x 2.9 x 3.8 cm. Uterus is retroverted. A small
cystic area in the mid uterus is 0.4 x 0.2 x 0.3 cm.

Endometrium

Thickness: 12.1 mm. A small amount of fluid is seen within the
endometrial canal.

Right ovary

Measurements: 2.3 x 1.0 x 1.2 cm. Normal postmenopausal appearance.

Left ovary

Measurements: 2.2 x 0.9 x 1.2 cm. Normal postmenopausal appearance.

Other findings

No free fluid.
IMPRESSION: 1. Retroverted uterus.
2. Thickened endometrial stripe and endometrial fluid. Endometrial
thickness is considered abnormal for an asymptomatic post-menopausal
female. Endometrial sampling should be considered to exclude
carcinoma.
3. Normal appearance of the ovaries.

## 2015-10-17 ENCOUNTER — Other Ambulatory Visit: Payer: Self-pay | Admitting: Family Medicine

## 2015-10-17 NOTE — Telephone Encounter (Signed)
Refill appropriate and filled per protocol. 

## 2017-02-15 ENCOUNTER — Encounter: Payer: Self-pay | Admitting: Family Medicine

## 2017-02-15 ENCOUNTER — Ambulatory Visit (INDEPENDENT_AMBULATORY_CARE_PROVIDER_SITE_OTHER): Payer: BC Managed Care – PPO | Admitting: Family Medicine

## 2017-02-15 VITALS — BP 116/74 | HR 78 | Temp 98.5°F | Resp 14 | Ht 61.0 in | Wt 179.0 lb

## 2017-02-15 DIAGNOSIS — K458 Other specified abdominal hernia without obstruction or gangrene: Secondary | ICD-10-CM

## 2017-02-15 DIAGNOSIS — R519 Headache, unspecified: Secondary | ICD-10-CM

## 2017-02-15 DIAGNOSIS — R51 Headache: Secondary | ICD-10-CM

## 2017-02-15 DIAGNOSIS — Z9889 Other specified postprocedural states: Secondary | ICD-10-CM

## 2017-02-15 MED ORDER — HYDROCODONE-ACETAMINOPHEN 5-325 MG PO TABS: 1.0000 | ORAL_TABLET | Freq: Four times a day (QID) | ORAL | 0 refills | Status: DC | PRN

## 2017-02-15 MED ORDER — KETOROLAC TROMETHAMINE 60 MG/2ML IM SOLN
60.0000 mg | Freq: Once | INTRAMUSCULAR | Status: AC
  Administered 2017-02-15: 60 mg via INTRAMUSCULAR

## 2017-02-15 NOTE — Progress Notes (Signed)
   Subjective:    Patient ID: [name], female    DOB: 05/26/1956, 61 y.o.   MRN: [id number]  Patient presents for Frequent HA (pain in upper portion of L side of head- associated with nausea and fatigue)   Last Tuesday went to dog sit in [place] has a severe stabbing headache on left side of head, this has perisisted over the past week, now head sore to touch, has some nausea associated, no vomiting. NO change in vision. Used heat to head Took benadryl, sudafed thinking it was sinus related, then advil  Has had headaches in past Only OTC supplement is magneisum   Family history of Migraines in Mother   Review Of Systems:  GEN- denies fatigue, fever, weight loss,weakness, recent illness HEENT- denies eye drainage, change in vision, nasal discharge, CVS- denies chest pain, palpitations RESP- denies SOB, cough, wheeze ABD- denies N/V, change in stools, abd pain GU- denies dysuria, hematuria, dribbling, incontinence MSK- denies joint pain, muscle aches, injury Neuro- + headache, dizziness, syncope, seizure activity       Objective:    BP 116/74   Pulse 78   Temp 98.5 F (36.9 C) (Oral)   Resp 14   Ht 5\' 1"  (1.549 m)   Wt 179 lb (81.2 kg)   SpO2 98%   BMI 33.82 kg/m  GEN- NAD, alert and oriented x3 HEENT- PERRL, EOMI, non injected sclera, pink conjunctiva, MMM, oropharynx clear, deviated septum with some bony overgrowth  Neck- Supple, neg spurlings  CVS- RRR, no murmur RESP-CTAB Neuro-CNII-XII in tact, no nystagmus, no papilledema  Pulses- Radial 2+        Assessment & Plan:      Problem List Items Addressed This Visit    None    Visit Diagnoses    Acute intractable headache, unspecified headache type    -  Primary   Severe HA, DD severe migraine, possible blocked sinus has known sinus disease and surgery in past, no neurological deficit to suggest brain tumor. Given toradol, norco for pain short term Send for CT of head to evaluate   Relevant  Medications   HYDROcodone-acetaminophen (NORCO) 5-325 MG tablet   ketorolac (TORADOL) injection 60 mg (Completed)   Other Relevant Orders   CT Head Wo Contrast   H/O sinus surgery       Relevant Orders   CT Head Wo Contrast   Other specified abdominal hernia without obstruction or gangrene       at end of visit requested referral back to her surgeon to evaluate her hernia present since her bowel surgery years ago.  Her GYN has seen they recommended her consulting for repair      Note: This dictation was prepared with Dragon dictation along with smaller phrase technology. Any transcriptional errors that result from this process are unintentional.

## 2017-02-15 NOTE — Patient Instructions (Addendum)
CT scan of head/sinuses  Toradol given  Referral to general surgery for abdominal hernia  F/U pending results

## 2017-02-16 ENCOUNTER — Ambulatory Visit
Admission: RE | Admit: 2017-02-16 | Discharge: 2017-02-16 | Disposition: A | Discharge status: Home / Self Care | Payer: BC Managed Care – PPO | Source: Ambulatory Visit | Attending: Family Medicine | Admitting: Family Medicine

## 2017-02-16 DIAGNOSIS — Z9889 Other specified postprocedural states: Secondary | ICD-10-CM

## 2017-02-16 DIAGNOSIS — R519 Headache, unspecified: Secondary | ICD-10-CM

## 2017-02-16 DIAGNOSIS — R51 Headache: Principal | ICD-10-CM

## 2017-06-25 ENCOUNTER — Emergency Department (HOSPITAL_COMMUNITY): Payer: BC Managed Care – PPO

## 2017-06-25 ENCOUNTER — Encounter (HOSPITAL_COMMUNITY): Payer: Self-pay | Admitting: *Deleted

## 2017-06-25 DIAGNOSIS — M79661 Pain in right lower leg: Secondary | ICD-10-CM | POA: Insufficient documentation

## 2017-06-25 DIAGNOSIS — Z79899 Other long term (current) drug therapy: Secondary | ICD-10-CM | POA: Diagnosis not present

## 2017-06-25 DIAGNOSIS — E039 Hypothyroidism, unspecified: Secondary | ICD-10-CM | POA: Insufficient documentation

## 2017-06-25 DIAGNOSIS — R079 Chest pain, unspecified: Secondary | ICD-10-CM | POA: Diagnosis present

## 2017-06-25 LAB — BASIC METABOLIC PANEL
ANION GAP: 9 (ref 5–15)
BUN: 18 mg/dL (ref 6–20)
CHLORIDE: 104 mmol/L (ref 101–111)
CO2: 28 mmol/L (ref 22–32)
Calcium: 9.4 mg/dL (ref 8.9–10.3)
Creatinine, Ser: 0.91 mg/dL (ref 0.44–1.00)
GFR calc non Af Amer: >60 mL/min (ref >60)
Glucose, Bld: 103 mg/dL — ABNORMAL HIGH (ref 65–99)
POTASSIUM: 3.8 mmol/L (ref 3.5–5.1)
Sodium: 141 mmol/L (ref 135–145)

## 2017-06-25 LAB — CBC
HEMATOCRIT: 44.7 % (ref 36.0–46.0)
HEMOGLOBIN: 14.7 g/dL (ref 12.0–15.0)
MCH: 29.6 pg (ref 26.0–34.0)
MCHC: 32.9 g/dL (ref 30.0–36.0)
MCV: 89.9 fL (ref 78.0–100.0)
Platelets: 306 10*3/uL (ref 150–400)
RBC: 4.97 MIL/uL (ref 3.87–5.11)
RDW: 14.2 % (ref 11.5–15.5)
WBC: 8.2 10*3/uL (ref 4.0–10.5)

## 2017-06-25 LAB — I-STAT TROPONIN, ED: Troponin i, poc: 0.01 ng/mL (ref 0.00–0.08)

## 2017-06-25 NOTE — ED Triage Notes (Signed)
Pt is here with CP since yesterday. She states that she initially thought it was indigestion as baking soda as well as belching gave her some temporary relief.   Pt is tearful and anxious in triage.  She denies any sob, no diaphoresis.  Pt also states that she has some pain while sitting behind her right knee and she is concerned about a blood clot.  Pt does not have any swelling in this leg and denies any sob.  Pt does note recent drive to and from [place].

## 2017-06-26 ENCOUNTER — Observation Stay (HOSPITAL_BASED_OUTPATIENT_CLINIC_OR_DEPARTMENT_OTHER): Payer: BC Managed Care – PPO

## 2017-06-26 ENCOUNTER — Observation Stay (HOSPITAL_COMMUNITY)
Admission: EM | Admit: 2017-06-26 | Discharge: 2017-06-26 | Disposition: A | Discharge status: Home / Self Care | Payer: BC Managed Care – PPO | Attending: Internal Medicine | Admitting: Internal Medicine

## 2017-06-26 ENCOUNTER — Observation Stay (HOSPITAL_COMMUNITY): Payer: BC Managed Care – PPO

## 2017-06-26 DIAGNOSIS — R079 Chest pain, unspecified: Secondary | ICD-10-CM | POA: Diagnosis not present

## 2017-06-26 DIAGNOSIS — M79661 Pain in right lower leg: Secondary | ICD-10-CM

## 2017-06-26 DIAGNOSIS — Z91041 Radiographic dye allergy status: Secondary | ICD-10-CM | POA: Diagnosis present

## 2017-06-26 DIAGNOSIS — M79609 Pain in unspecified limb: Secondary | ICD-10-CM

## 2017-06-26 DIAGNOSIS — T508X5A Adverse effect of diagnostic agents, initial encounter: Secondary | ICD-10-CM | POA: Diagnosis present

## 2017-06-26 LAB — I-STAT TROPONIN, ED: TROPONIN I, POC: 0 ng/mL (ref 0.00–0.08)

## 2017-06-26 LAB — HIV ANTIBODY (ROUTINE TESTING W REFLEX): HIV SCREEN 4TH GENERATION: NONREACTIVE

## 2017-06-26 LAB — TROPONIN I

## 2017-06-26 LAB — D-DIMER, QUANTITATIVE: D-Dimer, Quant: 1.12 ug/mL-FEU — ABNORMAL HIGH (ref 0.00–0.50)

## 2017-06-26 MED ORDER — TECHNETIUM TC 99M DIETHYLENETRIAME-PENTAACETIC ACID
33.0000 | Freq: Once | INTRAVENOUS | Status: AC | PRN
  Administered 2017-06-26: 33 via RESPIRATORY_TRACT

## 2017-06-26 MED ORDER — ACETAMINOPHEN 325 MG PO TABS: 650.0000 mg | ORAL_TABLET | Freq: Four times a day (QID) | ORAL | Status: DC | PRN

## 2017-06-26 MED ORDER — HEPARIN BOLUS VIA INFUSION
4000.0000 [IU] | Freq: Once | INTRAVENOUS | Status: AC
  Administered 2017-06-26: 4000 [IU] via INTRAVENOUS
  Filled 2017-06-26: qty 4000

## 2017-06-26 MED ORDER — ONDANSETRON HCL 4 MG/2ML IJ SOLN: 4.0000 mg | Freq: Four times a day (QID) | INTRAMUSCULAR | Status: DC | PRN

## 2017-06-26 MED ORDER — TECHNETIUM TO 99M ALBUMIN AGGREGATED
4.4000 | Freq: Once | INTRAVENOUS | Status: AC | PRN
  Administered 2017-06-26: 4.4 via INTRAVENOUS

## 2017-06-26 MED ORDER — HEPARIN (PORCINE) IN NACL 100-0.45 UNIT/ML-% IJ SOLN
1100.0000 [IU]/h | INTRAMUSCULAR | Status: DC
  Administered 2017-06-26: 1100 [IU]/h via INTRAVENOUS
  Filled 2017-06-26 (×2): qty 250

## 2017-06-26 MED ORDER — GI COCKTAIL ~~LOC~~: 30.0000 mL | Freq: Three times a day (TID) | ORAL | Status: DC | PRN

## 2017-06-26 MED ORDER — HEPARIN BOLUS VIA INFUSION
3500.0000 [IU] | Freq: Once | INTRAVENOUS | Status: DC
  Filled 2017-06-26: qty 3500

## 2017-06-26 MED ORDER — ACETAMINOPHEN 650 MG RE SUPP: 650.0000 mg | Freq: Four times a day (QID) | RECTAL | Status: DC | PRN

## 2017-06-26 MED ORDER — ONDANSETRON HCL 4 MG PO TABS: 4.0000 mg | ORAL_TABLET | Freq: Four times a day (QID) | ORAL | Status: DC | PRN

## 2017-06-26 NOTE — Progress Notes (Signed)
VASCULAR LAB PRELIMINARY  PRELIMINARY  PRELIMINARY  PRELIMINARY  Bilateral lower extremity venous duplex completed.    Preliminary report:  There is no obvious evidence of DVT or SVT noted in the bilateral lower extremities.   Dietrich Samuelson, RVT 06/26/2017, 10:31 AM

## 2017-06-26 NOTE — Discharge Summary (Signed)
Physician Discharge Summary  [name] WNU:[id number] DOB: 12/31/1955 DOA: 06/26/2017  PCP: [name], MD  Admit date: 06/26/2017 Discharge date: 06/26/2017  Admitted From: home Disposition:  home    Discharge Condition:  stable   CODE STATUS:  Full code   Consultations:  none    Discharge Diagnoses:  Principal Problem:   Chest pain Active Problems:   History of allergy to radiographic contrast media    Subjective: She has a "tender spot on her left chest wall" and behind her right knee. No other complaints. No dyspnea.   Brief Summary: [name] is a 61 y.o. female with medical history significant of Diverticulitis.  Patient presents to the ED with c/o chest pain.  Noteably she has frequent long distance travel history recently.  Recently drove back from [place].  She she also reports some calf pain behind her R knee.  Initially thought it was indigestion as baking soda and belching gave some temporary relief.   Hospital Course:  Chest pain - dye allergy, CTA could not be done - admitted and VQ and venous duplex obtained- both negative for CVA - on exam, she is tender in her left chest wall and behind right knee- states she was moving heavy furniture yesterday - can apply some aspercreme  Discharge Instructions  Discharge Instructions    Diet - low sodium heart healthy    Complete by:  As directed    Increase activity slowly    Complete by:  As directed      Allergies as of 06/26/2017      Reactions   Contrast Media [iodinated Diagnostic Agents] Hives, Shortness Of Breath   Skin flushed and felt burned   Other Other (See Comments)   trinalin - for sinus infection - keeps me awake      Medication List    STOP taking these medications   HYDROcodone-acetaminophen 5-325 MG tablet Commonly known as:  NORCO     TAKE these medications   EPIPEN 2-PAK 0.3 mg/0.3 mL Soaj injection Generic drug:  EPINEPHrine INJECT INTO THE MUSCLE  ONCE AS NEEDED FOR ALLERGIC REACTION THEN PROCEED TO THE ED   FLAX SEED OIL PO Take 1 tablet by mouth daily.   MAGNESIUM PO Take 1 tablet by mouth daily.   multivitamin tablet Take 1 tablet by mouth daily.   PROBIOTIC DAILY PO Take 1 capsule by mouth daily. Prescript-assist broad spectrum probiotic & prebiotic   Vitamin D3 Liqd Take 7 drops by mouth daily.            Discharge Care Instructions        Start     Ordered   06/26/17 0000  Increase activity slowly     06/26/17 1100   06/26/17 0000  Diet - low sodium heart healthy     06/26/17 1100      Allergies  Allergen Reactions  . Contrast Media [Iodinated Diagnostic Agents] Hives and Shortness Of Breath    Skin flushed and felt burned  . Other Other (See Comments)    trinalin - for sinus infection - keeps me awake     Procedures/Studies:  Bilateral lower extremity venous duplex completed.    Preliminary report:  There is no obvious evidence of DVT or SVT noted in the bilateral lower extremities.   Dg Chest 2 View  Result Date: 06/25/2017 CLINICAL DATA:  Chest pain, onset yesterday. EXAM: CHEST  2 VIEW COMPARISON:  None. FINDINGS: The cardiomediastinal contours are  normal. The lungs are clear. Pulmonary vasculature is normal. No consolidation, pleural effusion, or pneumothorax. No acute osseous abnormalities are seen. Degenerative change in the spine. IMPRESSION: No acute pulmonary process. Electronically Signed   By: Jeb Levering M.D.   On: 06/25/2017 23:11   Nm Pulmonary Perf And Vent  Result Date: 06/26/2017 CLINICAL DATA:  Positive D-dimer. EXAM: NUCLEAR MEDICINE VENTILATION - PERFUSION LUNG SCAN TECHNIQUE: Ventilation images were obtained in multiple projections using inhaled aerosol Tc-40m DTPA. Perfusion images were obtained in multiple projections after intravenous injection of Tc-82m MAA. RADIOPHARMACEUTICALS:  33 mCi Technetium-49m DTPA aerosol inhalation and 4.4 mCi Technetium-62m MAA IV  COMPARISON:  Chest x-ray 06/25/2017 FINDINGS: Ventilation: No focal ventilation defect. Perfusion: No wedge shaped peripheral perfusion defects to suggest acute pulmonary embolism. IMPRESSION: Normal lung perfusion bilaterally. Electronically Signed   By: Misty Stanley M.D.   On: 06/26/2017 10:31        Discharge Exam: Vitals:   06/26/17 0515 06/26/17 0535  BP: 123/71 (!) 137/48  Pulse: 63 72  Resp: 16 14  Temp:  98.2 F (36.8 C)  SpO2: 98% 99%   Vitals:   06/26/17 0416 06/26/17 0501 06/26/17 0515 06/26/17 0535  BP:  126/67 123/71 (!) 137/48  Pulse:  67 63 72  Resp:  18 16 14   Temp:    98.2 F (36.8 C)  TempSrc:      SpO2:  98% 98% 99%  Weight: 79.9 kg (176 lb 3.2 oz)   81.6 kg (179 lb 14.4 oz)  Height: 5\' 1"  (1.549 m)       General: Pt is alert, awake, not in acute distress Cardiovascular: RRR, S1/S2 +, no rubs, no gallops Respiratory: CTA bilaterally, no wheezing, no rhonchi Abdominal: Soft, NT, ND, bowel sounds + Extremities: no edema, no cyanosis- mildly tender behind right knee Chest wall- small area of tenderness in left rib cage    - The results of significant diagnostics from this hospitalization (including imaging, microbiology, ancillary and laboratory) are listed below for reference.     Microbiology: No results found for this or any previous visit (from the past 240 hour(s)).   Labs: BNP (last 3 results) No results for input(s): BNP in the last 8760 hours. Basic Metabolic Panel:  Recent Labs Lab 06/25/17 2236  NA 141  K 3.8  CL 104  CO2 28  GLUCOSE 103*  BUN 18  CREATININE 0.91  CALCIUM 9.4   Liver Function Tests: No results for input(s): AST, ALT, ALKPHOS, BILITOT, PROT, ALBUMIN in the last 168 hours. No results for input(s): LIPASE, AMYLASE in the last 168 hours. No results for input(s): AMMONIA in the last 168 hours. CBC:  Recent Labs Lab 06/25/17 2236  WBC 8.2  HGB 14.7  HCT 44.7  MCV 89.9  PLT 306   Cardiac  Enzymes:  Recent Labs Lab 06/26/17 0500  TROPONINI <0.03   BNP: Invalid input(s): POCBNP CBG: No results for input(s): GLUCAP in the last 168 hours. D-Dimer  Recent Labs  06/26/17 0323  DDIMER 1.12*   Hgb A1c No results for input(s): HGBA1C in the last 72 hours. Lipid Profile No results for input(s): CHOL, HDL, LDLCALC, TRIG, CHOLHDL, LDLDIRECT in the last 72 hours. Thyroid function studies No results for input(s): TSH, T4TOTAL, T3FREE, THYROIDAB in the last 72 hours.  Invalid input(s): FREET3 Anemia work up No results for input(s): VITAMINB12, FOLATE, FERRITIN, TIBC, IRON, RETICCTPCT in the last 72 hours. Urinalysis    Component Value Date/Time   COLORURINE YELLOW  02/05/2014 [place] 02/05/2014 1644   LABSPEC 1.020 02/05/2014 1644   PHURINE 7.0 02/05/2014 1644   GLUCOSEU NEG 02/05/2014 1644   HGBUR NEG 02/05/2014 1644   BILIRUBINUR NEG 02/05/2014 1644   KETONESUR NEG 02/05/2014 1644   PROTEINUR NEG 02/05/2014 1644   UROBILINOGEN 0.2 02/05/2014 1644   NITRITE NEG 02/05/2014 1644   LEUKOCYTESUR NEG 02/05/2014 1644   Sepsis Labs Invalid input(s): PROCALCITONIN,  WBC,  LACTICIDVEN Microbiology No results found for this or any previous visit (from the past 240 hour(s)).   Time coordinating discharge: Over 30 minutes  SIGNED:   Debbe Odea, MD  [facility] 06/26/2017, 11:01 AM Pager   If 7PM-7AM, please contact night-coverage [email or web address] Password [email or web address]

## 2017-06-26 NOTE — H&P (Signed)
History and Physical    [name] BMW:[id number] DOB: [birth year] DOA: 06/26/2017  PCP: [name], MD  Patient coming from: Home  I have personally briefly reviewed patient's old medical records in [place]  Chief Complaint: Chest pain  HPI: [name] is a 61 y.o. female with medical history significant of Diverticulitis.  Patient presents to the ED with c/o chest pain.  Noteably she has frequent long distance travel history recently.  Recently drove back from [place].  She she also reports some calf pain behind her R knee.  Initially thought it was indigestion as baking soda and belching gave some temporary relief.  No SOB, no diaphoresis.  She is now concerned about a blood clot and so came to the ED.   ED Course: D.Dimer is positive at 1.12.  Trop neg x2.  No tachycardia nor O2 requirement.  She is allergic to contrast dye so CTA cant be performed.  Hospitalist asked to admit for VQ scan in AM.   Review of Systems: As per HPI otherwise 10 point review of systems negative.   Past Medical History:  Diagnosis Date  . Abnormal EKG   . Diverticulitis of large intestine with perforation 02/07/2012  . GERD (gastroesophageal reflux disease)    no meds  . Heart murmur    as a child   . Hypothyroidism    hx of  15 yrs ago  . Palpitations    occasional PVCs and PACs on Holter monitor  . PONV (postoperative nausea and vomiting)     Past Surgical History:  Procedure Laterality Date  . BREAST SURGERY     right benign breast lump removed   . COLOSTOMY  02/07/2012   Procedure: COLOSTOMY;  Surgeon: Gayland Curry, MD,FACS;  Location: [facility];  Service: General;  Laterality: Left;  . COLOSTOMY TAKEDOWN  09/01/2012   Procedure: LAPAROSCOPIC COLOSTOMY TAKEDOWN;  Surgeon: Gayland Curry, MD,FACS;  Location: [facility];  Service: General;  Laterality: N/A;  Lap Assisted Colostomy Reversal   . FLEXIBLE SIGMOIDOSCOPY  09/01/2012   Procedure: FLEXIBLE SIGMOIDOSCOPY;  Surgeon:  Gayland Curry, MD,FACS;  Location: [facility];  Service: General;;  . HYSTEROSCOPY W/D&C N/A 07/17/2014   Procedure: DILATATION AND CURETTAGE Pollyann Glen;  Surgeon: Marvene Staff, MD;  Location: [facility];  Service: Gynecology;  Laterality: N/A;  . LAPAROSCOPIC SIGMOID COLECTOMY  09/01/2012   Procedure: LAPAROSCOPIC SIGMOID COLECTOMY;  Surgeon: Gayland Curry, MD,FACS;  Location: [facility];  Service: General;;  . LAPAROTOMY  02/07/2012   Procedure: EXPLORATORY LAPAROTOMY;  Surgeon: Gayland Curry, MD,FACS;  Location: [facility];  Service: General;  Laterality: Bilateral;  . NOSE SURGERY    . PARTIAL COLECTOMY  02/07/2012   Procedure: PARTIAL COLECTOMY;  Surgeon: Gayland Curry, MD,FACS;  Location: Home;  Service: General;  Laterality: N/A;  . ROTATOR CUFF REPAIR    . TUBAL LIGATION       reports that she has never smoked. She has never used smokeless tobacco. She reports that she does not drink alcohol or use drugs.  Allergies  Allergen Reactions  . Contrast Media [Iodinated Diagnostic Agents] Hives and Shortness Of Breath    Skin flushed and felt burned  . Other Other (See Comments)    trinalin - for sinus infection - keeps me awake    Family History  Problem Relation Age of Onset  . Diabetes Father   . Hypertension Father      Prior to Admission medications  Medication Sig Start Date End Date Taking? Authorizing Provider  Cholecalciferol (VITAMIN D3) LIQD Take 7 drops by mouth daily.   Yes [provider]  EPIPEN 2-PAK 0.3 MG/0.3ML SOAJ injection INJECT INTO THE MUSCLE ONCE AS NEEDED FOR ALLERGIC REACTION THEN PROCEED TO THE ED 10/17/15  Yes [name], MD  Flaxseed, Linseed, (FLAX SEED OIL PO) Take 1 tablet by mouth daily.   Yes [provider]  MAGNESIUM PO Take 1 tablet by mouth daily.   Yes [provider]  Multiple Vitamin (MULTIVITAMIN) tablet Take 1 tablet by mouth daily.   Yes [provider]  Probiotic Product (PROBIOTIC DAILY PO) Take 1  capsule by mouth daily. Prescript-assist broad spectrum probiotic & prebiotic   Yes [provider]  HYDROcodone-acetaminophen (NORCO) 5-325 MG tablet Take 1 tablet by mouth every 6 (six) hours as needed for moderate pain. Patient not taking: Reported on 06/26/2017 02/15/17   [name], MD    Physical Exam: Vitals:   06/26/17 0040 06/26/17 0339 06/26/17 0400 06/26/17 0416  BP: 128/73 127/60 118/60   Pulse: 63 68 67   Resp: 18 (!) 24 20   Temp: 98.3 F (36.8 C)     TempSrc: Oral     SpO2: 98% 96% 95%   Weight:    79.9 kg (176 lb 3.2 oz)  Height:    5\' 1"  (1.549 m)    Constitutional: NAD, calm, comfortable Eyes: PERRL, lids and conjunctivae normal ENMT: Mucous membranes are moist. Posterior pharynx clear of any exudate or lesions.Normal dentition.  Neck: normal, supple, no masses, no thyromegaly Respiratory: clear to auscultation bilaterally, no wheezing, no crackles. Normal respiratory effort. No accessory muscle use.  Cardiovascular: Regular rate and rhythm, no murmurs / rubs / gallops. No extremity edema. 2+ pedal pulses. No carotid bruits.  Abdomen: no tenderness, no masses palpated. No hepatosplenomegaly. Bowel sounds positive.  Musculoskeletal: no clubbing / cyanosis. No joint deformity upper and lower extremities. Good ROM, no contractures. Normal muscle tone.  Skin: no rashes, lesions, ulcers. No induration Neurologic: CN 2-12 grossly intact. Sensation intact, DTR normal. Strength 5/5 in all 4.  Psychiatric: Normal judgment and insight. Alert and oriented x 3. Normal mood.    Labs on Admission: I have personally reviewed following labs and imaging studies  CBC:  Recent Labs Lab 06/25/17 2236  WBC 8.2  HGB 14.7  HCT 44.7  MCV 89.9  PLT 833   Basic Metabolic Panel:  Recent Labs Lab 06/25/17 2236  NA 141  K 3.8  CL 104  CO2 28  GLUCOSE 103*  BUN 18  CREATININE 0.91  CALCIUM 9.4   GFR: Estimated Creatinine Clearance: 60.5 mL/min (by C-G  formula based on SCr of 0.91 mg/dL). Liver Function Tests: No results for input(s): AST, ALT, ALKPHOS, BILITOT, PROT, ALBUMIN in the last 168 hours. No results for input(s): LIPASE, AMYLASE in the last 168 hours. No results for input(s): AMMONIA in the last 168 hours. Coagulation Profile: No results for input(s): INR, PROTIME in the last 168 hours. Cardiac Enzymes: No results for input(s): CKTOTAL, CKMB, CKMBINDEX, TROPONINI in the last 168 hours. BNP (last 3 results) No results for input(s): PROBNP in the last 8760 hours. HbA1C: No results for input(s): HGBA1C in the last 72 hours. CBG: No results for input(s): GLUCAP in the last 168 hours. Lipid Profile: No results for input(s): CHOL, HDL, LDLCALC, TRIG, CHOLHDL, LDLDIRECT in the last 72 hours. Thyroid Function Tests: No results for input(s): TSH, T4TOTAL, FREET4,  T3FREE, THYROIDAB in the last 72 hours. Anemia Panel: No results for input(s): VITAMINB12, FOLATE, FERRITIN, TIBC, IRON, RETICCTPCT in the last 72 hours. Urine analysis:    Component Value Date/Time   COLORURINE YELLOW 02/05/2014 [place] 02/05/2014 1644   LABSPEC 1.020 02/05/2014 1644   PHURINE 7.0 02/05/2014 1644   GLUCOSEU NEG 02/05/2014 1644   HGBUR NEG 02/05/2014 1644   BILIRUBINUR NEG 02/05/2014 1644   KETONESUR NEG 02/05/2014 1644   PROTEINUR NEG 02/05/2014 1644   UROBILINOGEN 0.2 02/05/2014 1644   NITRITE NEG 02/05/2014 1644   LEUKOCYTESUR NEG 02/05/2014 1644    Radiological Exams on Admission: Dg Chest 2 View  Result Date: 06/25/2017 CLINICAL DATA:  Chest pain, onset yesterday. EXAM: CHEST  2 VIEW COMPARISON:  None. FINDINGS: The cardiomediastinal contours are normal. The lungs are clear. Pulmonary vasculature is normal. No consolidation, pleural effusion, or pneumothorax. No acute osseous abnormalities are seen. Degenerative change in the spine. IMPRESSION: No acute pulmonary process. Electronically Signed   By: Jeb Levering M.D.    On: 06/25/2017 23:11    EKG: Independently reviewed.  Assessment/Plan Principal Problem:   Chest pain Active Problems:   Allergic reaction to contrast dye    1. Chest pain - concern for possible PE, unable to get CTA due to prior allergic reaction to contrast dye. 1. NM VQ scan in AM 2. Venous duplex BLE to R/O DVT 3. Heparin gtt empirically until then. 4. Tele monitor 5. Serial trops 6. Try GI cocktail PRN  DVT prophylaxis: Heparin gtt Code Status: Full Family Communication: No family in room Disposition Plan: Home after admit Consults called: None Admission status: Place in obs   [place], [facility] Pager [phone]  If 7AM-7PM, please contact day team taking care of patient [email or web address] Password [facility]  06/26/2017, 4:27 AM

## 2017-06-26 NOTE — ED Provider Notes (Signed)
[facility] DEPT Provider Note   CSN: [id number] Arrival date & time: 06/25/17  2226     History   Chief Complaint Chief Complaint  Patient presents with  . Chest Pain    HPI [name] is a 61 y.o. female.  Patient presents to the ED with a chief complaint of chest pain with some SOB.  She states that the pain has been waxing and waning for the past several days.  She denies any history of ACS, PE, DVT.  She states that there are no exertional symptoms.  She states that she recently traveled to [place], and has been having some right calf pain.  She has not taken anything for her symptoms.  There are no other associated symptoms or modifying factors.   The history is provided by the patient. No language interpreter was used.    Past Medical History:  Diagnosis Date  . Abnormal EKG   . Diverticulitis of large intestine with perforation 02/07/2012  . GERD (gastroesophageal reflux disease)    no meds  . Heart murmur    as a child   . Hypothyroidism    hx of  15 yrs ago  . Palpitations    occasional PVCs and PACs on Holter monitor  . PONV (postoperative nausea and vomiting)     Patient Active Problem List   Diagnosis Date Noted  . Chest pain 06/26/2017  . Depression with anxiety 10/14/2014  . Palpitations 10/14/2014  . Nonspecific abnormal electrocardiogram (ECG) (EKG) 10/14/2014  . Allergic reaction to contrast dye 02/06/2014  . Abdominal pain, unspecified site 02/05/2014  . Diverticular disease 02/05/2014  . Dermatitis 07/12/2012    Past Surgical History:  Procedure Laterality Date  . BREAST SURGERY     right benign breast lump removed   . COLOSTOMY  02/07/2012   Procedure: COLOSTOMY;  Surgeon: Gayland Curry, MD,FACS;  Location: [facility];  Service: General;  Laterality: Left;  . COLOSTOMY TAKEDOWN  09/01/2012   Procedure: LAPAROSCOPIC COLOSTOMY TAKEDOWN;  Surgeon: Gayland Curry, MD,FACS;  Location: [facility];  Service: General;  Laterality: N/A;  Lap Assisted  Colostomy Reversal   . FLEXIBLE SIGMOIDOSCOPY  09/01/2012   Procedure: FLEXIBLE SIGMOIDOSCOPY;  Surgeon: Gayland Curry, MD,FACS;  Location: [facility];  Service: General;;  . HYSTEROSCOPY W/D&C N/A 07/17/2014   Procedure: DILATATION AND CURETTAGE Pollyann Glen;  Surgeon: Marvene Staff, MD;  Location: [facility];  Service: Gynecology;  Laterality: N/A;  . LAPAROSCOPIC SIGMOID COLECTOMY  09/01/2012   Procedure: LAPAROSCOPIC SIGMOID COLECTOMY;  Surgeon: Gayland Curry, MD,FACS;  Location[facility];  Service: General;;  . LAPAROTOMY  02/07/2012   Procedure: EXPLORATORY LAPAROTOMY;  Surgeon: Gayland Curry, MD,FACS;  Location: [facility];  Service: General;  Laterality: Bilateral;  . NOSE SURGERY    . PARTIAL COLECTOMY  02/07/2012   Procedure: PARTIAL COLECTOMY;  Surgeon: Gayland Curry, MD,FACS;  Location: [facility];  Service: General;  Laterality: N/A;  . ROTATOR CUFF REPAIR    . TUBAL LIGATION      OB History    No data available       Home Medications    Prior to Admission medications   Medication Sig Start Date End Date Taking? Authorizing Provider  Cholecalciferol (VITAMIN D3) LIQD Take 7 drops by mouth daily.   Yes [provider]  EPIPEN 2-PAK 0.3 MG/0.3ML SOAJ injection INJECT INTO THE MUSCLE ONCE AS NEEDED FOR ALLERGIC REACTION THEN PROCEED TO THE ED 10/17/15  Yes [name],  MD  Flaxseed, Linseed, (FLAX SEED OIL PO) Take 1 tablet by mouth daily.   Yes [provider]  MAGNESIUM PO Take 1 tablet by mouth daily.   Yes [provider]  Multiple Vitamin (MULTIVITAMIN) tablet Take 1 tablet by mouth daily.   Yes [provider]  Probiotic Product (PROBIOTIC DAILY PO) Take 1 capsule by mouth daily. Prescript-assist broad spectrum probiotic & prebiotic   Yes [provider]  HYDROcodone-acetaminophen (NORCO) 5-325 MG tablet Take 1 tablet by mouth every 6 (six) hours as needed for moderate pain. Patient not taking: Reported on 06/26/2017 02/15/17   [name], MD    Family History Family History  Problem Relation Age of Onset  . Diabetes Father   . Hypertension Father     Social History Social History  Substance Use Topics  . Smoking status: Never Smoker  . Smokeless tobacco: Never Used  . Alcohol use No     Allergies   Contrast media [iodinated diagnostic agents] and Other   Review of Systems Review of Systems  All other systems reviewed and are negative.    Physical Exam Updated Vital Signs BP 118/60   Pulse 67   Temp 98.3 F (36.8 C) (Oral)   Resp 20   Ht 5\' 1"  (1.549 m)   Wt 79.9 kg (176 lb 3.2 oz)   SpO2 95%   BMI 33.29 kg/m   Physical Exam  Constitutional: She is oriented to person, place, and time. She appears well-developed and well-nourished.  HENT:  Head: Normocephalic and atraumatic.  Eyes: Pupils are equal, round, and reactive to light. Conjunctivae and EOM are normal.  Neck: Normal range of motion. Neck supple.  Cardiovascular: Normal rate and regular rhythm.  Exam reveals no gallop and no friction rub.   No murmur heard. Pulmonary/Chest: Effort normal and breath sounds normal. No respiratory distress. She has no wheezes. She has no rales. She exhibits no tenderness.  Abdominal: Soft. Bowel sounds are normal. She exhibits no distension and no mass. There is no tenderness. There is no rebound and no guarding.  Musculoskeletal: Normal range of motion. She exhibits no edema or tenderness.  Neurological: She is alert and oriented to person, place, and time.  Skin: Skin is warm and dry.  Psychiatric: She has a normal mood and affect. Her behavior is normal. Judgment and thought content normal.  Nursing note and vitals reviewed.    ED Treatments / Results  Labs (all labs ordered are listed, but only abnormal results are displayed) Labs Reviewed  BASIC METABOLIC PANEL - Abnormal; Notable for the following:       Result Value   Glucose, Bld 103 (*)    All other components within normal  limits  D-DIMER, QUANTITATIVE (NOT AT [facility]) - Abnormal; Notable for the following:    D-Dimer, Quant 1.12 (*)    All other components within normal limits  CBC  TROPONIN I  TROPONIN I  TROPONIN I  HIV ANTIBODY (ROUTINE TESTING)  I-STAT TROPONIN, ED  I-STAT TROPONIN, ED    EKG  EKG Interpretation  Date/Time:  Saturday June 25 2017 22:27:17 EDT Ventricular Rate:  69 PR Interval:  144 QRS Duration: 80 QT Interval:  424 QTC Calculation: 454 R Axis:   -59 Text Interpretation:  Normal sinus rhythm Left axis deviation Low voltage QRS Inferior infarct , age undetermined Possible Anterolateral infarct , age undetermined Abnormal ECG No significant change since last tracing Confirmed by Pryor Curia [phone]) on 06/26/2017 4:02:56  AM       Radiology Dg Chest 2 View  Result Date: 06/25/2017 CLINICAL DATA:  Chest pain, onset yesterday. EXAM: CHEST  2 VIEW COMPARISON:  None. FINDINGS: The cardiomediastinal contours are normal. The lungs are clear. Pulmonary vasculature is normal. No consolidation, pleural effusion, or pneumothorax. No acute osseous abnormalities are seen. Degenerative change in the spine. IMPRESSION: No acute pulmonary process. Electronically Signed   By: Jeb Levering M.D.   On: 06/25/2017 23:11    Procedures Procedures (including critical care time) CRITICAL CARE Performed by: Montine Circle Start heparin drip for suspected PE  Total critical care time: 43 minutes  Critical care time was exclusive of separately billable procedures and treating other patients.  Critical care was necessary to treat or prevent imminent or life-threatening deterioration.  Critical care was time spent personally by me on the following activities: development of treatment plan with patient and/or surrogate as well as nursing, discussions with consultants, evaluation of patient's response to treatment, examination of patient, obtaining history from patient or surrogate, ordering and  performing treatments and interventions, ordering and review of laboratory studies, ordering and review of radiographic studies, pulse oximetry and re-evaluation of patient's condition.  Medications Ordered in ED Medications  acetaminophen (TYLENOL) tablet 650 mg (not administered)    Or  acetaminophen (TYLENOL) suppository 650 mg (not administered)  ondansetron (ZOFRAN) tablet 4 mg (not administered)    Or  ondansetron (ZOFRAN) injection 4 mg (not administered)  gi cocktail (Maalox,Lidocaine,Donnatal) (not administered)  heparin ADULT infusion 100 units/mL (25000 units/287mL sodium chloride 0.45%) (not administered)  heparin bolus via infusion 3,500 Units (not administered)     Initial Impression / Assessment and Plan / ED Course  I have reviewed the triage vital signs and the nursing notes.  Pertinent labs & imaging results that were available during my care of the patient were reviewed by me and considered in my medical decision making (see chart for details).     Patient with intermittent chest pain, SOB and calf pain.  Troponin is normal.  No ischemic EKG changes.  D-dimer is elevated.  Patient cannot get CT PE study due to anaphylactic reaction to contrast.    Discussed with Dr. Leonides Schanz, who agrees with starting patient on heparin and admitting.  Appreciate Dr. Alcario Drought for admitting the patient and facilitating the rest of the workup with VQ scan and [place].  Final Clinical Impressions(s) / ED Diagnoses   Final diagnoses:  Chest pain, unspecified type  Right calf pain    New Prescriptions New Prescriptions   No medications on file     Montine Circle, Hershal Coria 06/26/17 [place], Delice Bison, DO 06/26/17 0370

## 2017-06-26 NOTE — Progress Notes (Addendum)
ANTICOAGULATION CONSULT NOTE - Initial Consult  Pharmacy Consult for heparin  Indication: r/o pulmonary embolus  Allergies  Allergen Reactions  . Contrast Media [Iodinated Diagnostic Agents] Hives and Shortness Of Breath    Skin flushed and felt burned  . Other Other (See Comments)    trinalin - for sinus infection - keeps me awake    Patient Measurements: Weight: 176 lb 3.2 oz (79.9 kg) Heparin Dosing Weight: 65.8 kg   Vital Signs: Temp: 98.3 F (36.8 C) (08/26 0040) Temp Source: Oral (08/26 0040) BP: 127/60 (08/26 0339) Pulse Rate: 68 (08/26 0339)  Labs:  Recent Labs  06/25/17 2236  HGB 14.7  HCT 44.7  PLT 306  CREATININE 0.91    Estimated Creatinine Clearance: 60.5 mL/min (by C-G formula based on SCr of 0.91 mg/dL).   Medical History: Past Medical History:  Diagnosis Date  . Abnormal EKG   . Diverticulitis of large intestine with perforation 02/07/2012  . GERD (gastroesophageal reflux disease)    no meds  . Heart murmur    as a child   . Hypothyroidism    hx of  15 yrs ago  . Palpitations    occasional PVCs and PACs on Holter monitor  . PONV (postoperative nausea and vomiting)    Assessment: 61 yo female admitted with chest pain. Pharmacy consulted to dose heparin for possible PE. No oral anticoagulation PTA.   Trop 0.01 and D-dimer elevated at 1.12. No imaging available at this time. CBC stable and no s/s bleeding noted.   Goal of Therapy:  Heparin level 0.3-0.7 units/ml Monitor platelets by anticoagulation protocol: Yes   Plan:  Bolus heparin 4000 units x1 Start heparin gtt at 1100 units/hr Heparin level in 6 hrs  Daily heparin level and CBC Monitor for s/s bleeding  Argie Ramming, PharmD Clinical Pharmacist 06/26/17 4:22 AM

## 2017-06-26 NOTE — Discharge Instructions (Signed)

## 2018-08-23 LAB — HM MAMMOGRAPHY

## 2018-09-30 IMAGING — DX DG CHEST 2V
2 series · 2 of 2 positions shown · non-contrast
Comparison: None.

CLINICAL DATA: Chest pain, onset yesterday.

EXAM:
CHEST  2 VIEW

[chest pa]
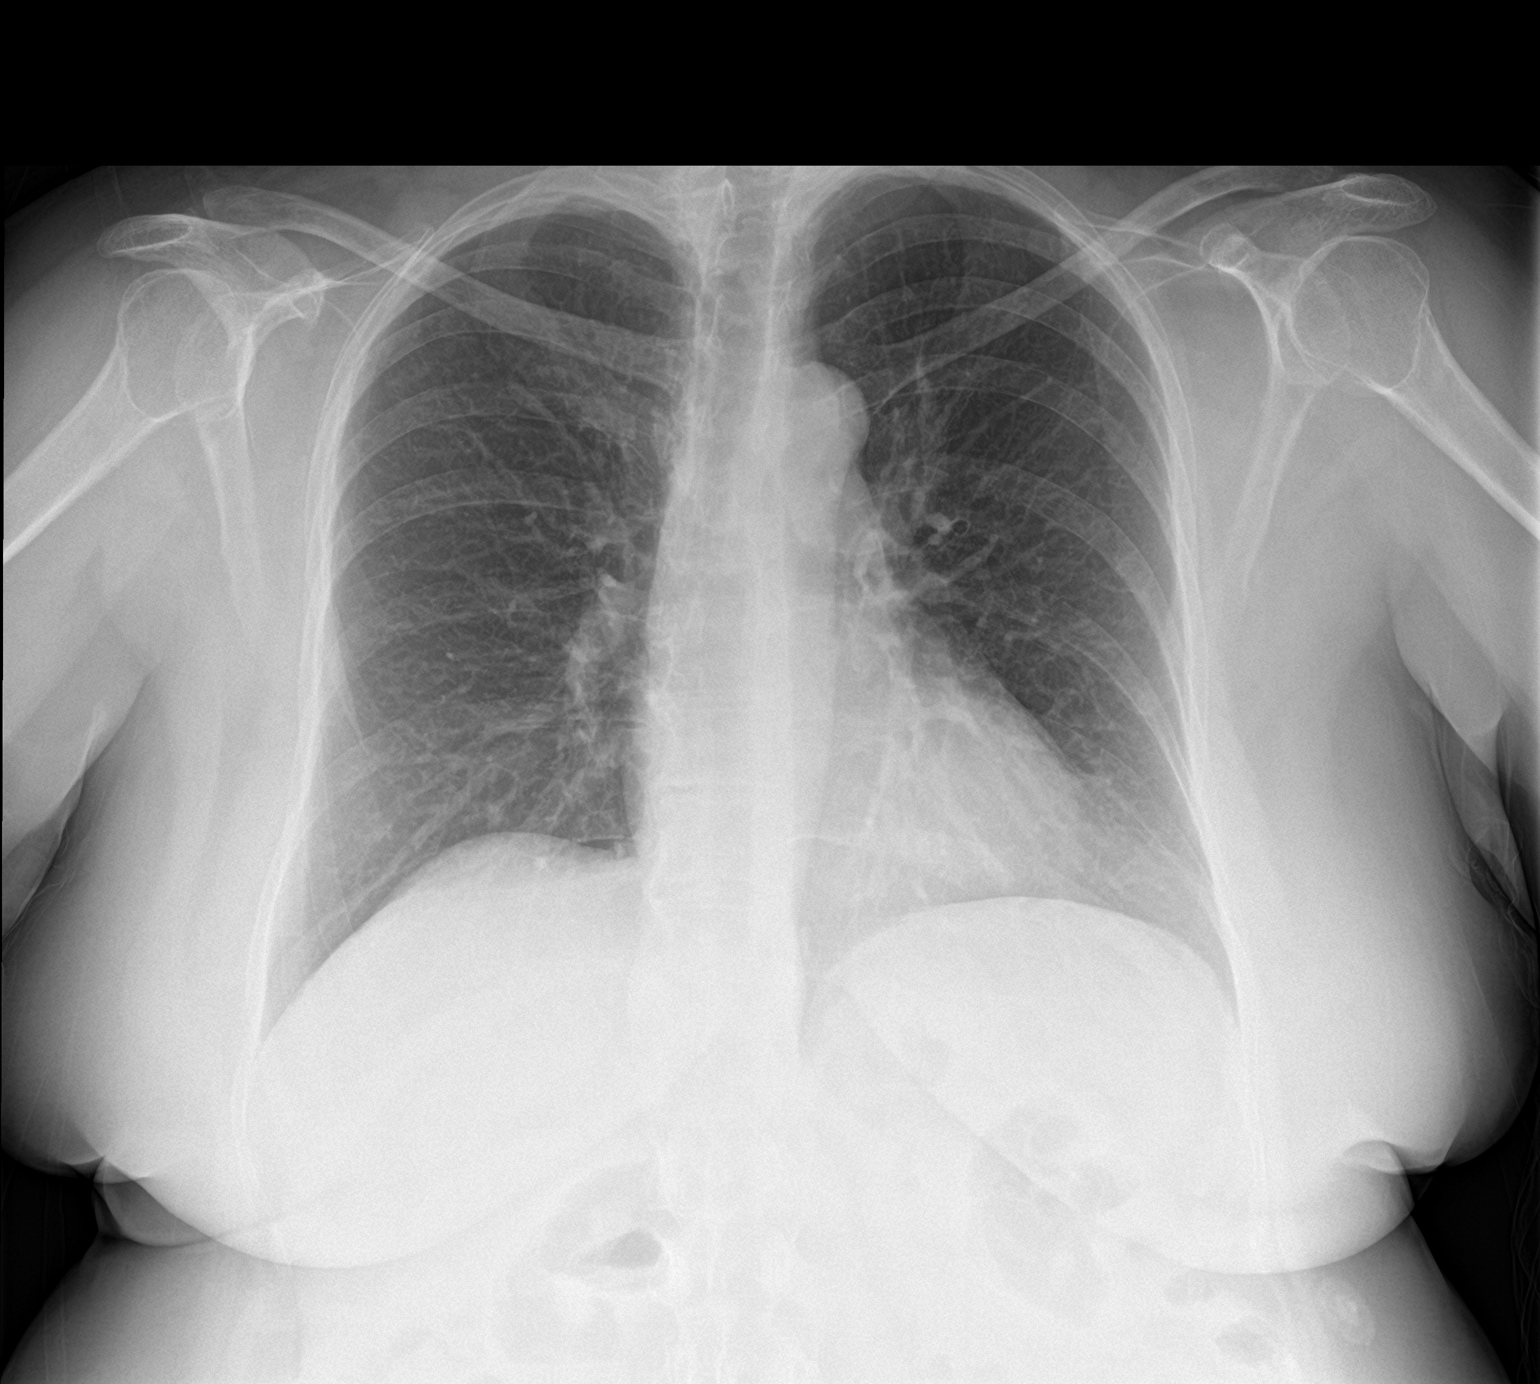

[chest lat]
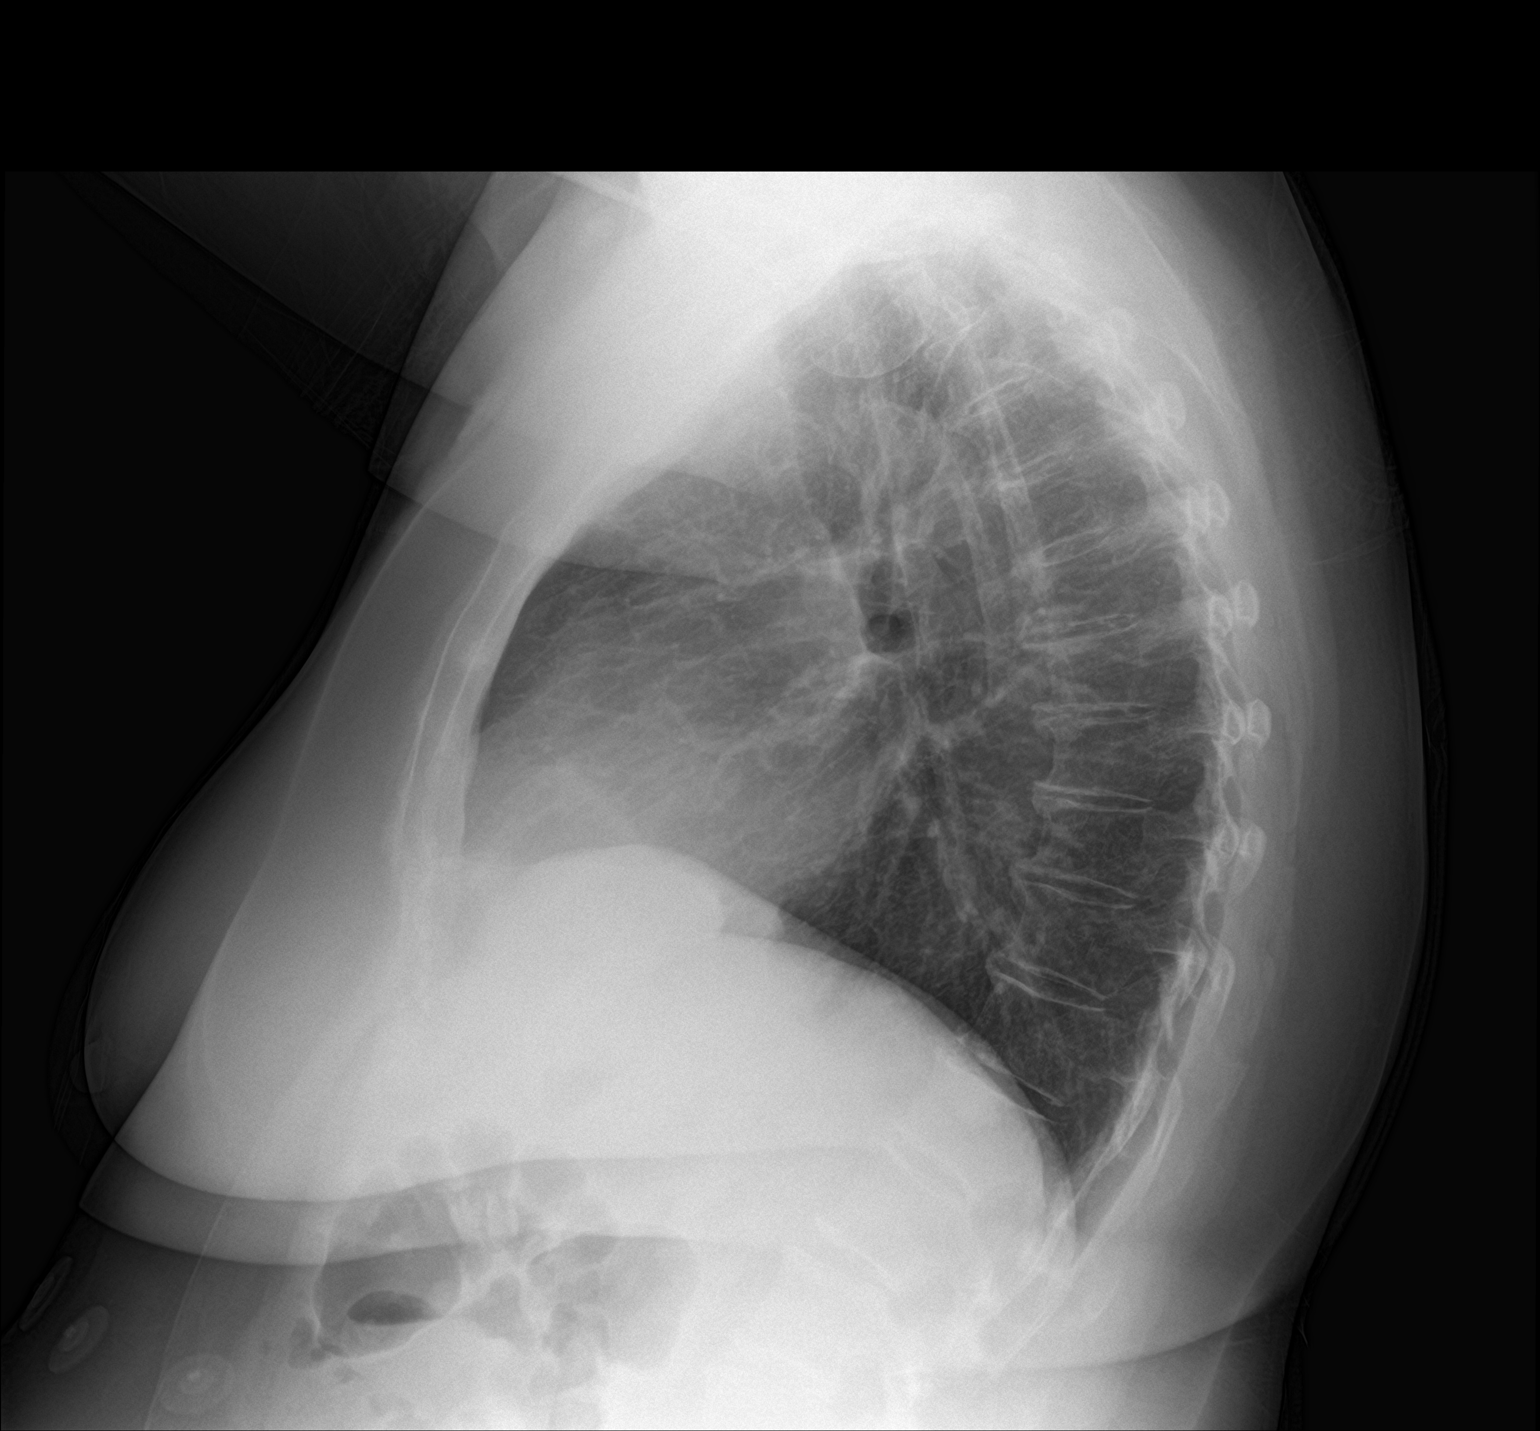

[2 of 2 positions shown; findings below may reference images not displayed]

FINDINGS: The cardiomediastinal contours are normal. The lungs are clear.
Pulmonary vasculature is normal. No consolidation, pleural effusion,
or pneumothorax. No acute osseous abnormalities are seen.
Degenerative change in the spine.
IMPRESSION: No acute pulmonary process.

## 2019-05-01 ENCOUNTER — Other Ambulatory Visit: Payer: Self-pay

## 2019-05-01 ENCOUNTER — Ambulatory Visit (HOSPITAL_COMMUNITY)
Admission: EM | Admit: 2019-05-01 | Discharge: 2019-05-01 | Disposition: A | Discharge status: Home / Self Care | Payer: Medicare Other | Attending: Family Medicine | Admitting: Family Medicine

## 2019-05-01 ENCOUNTER — Encounter (HOSPITAL_COMMUNITY): Payer: Self-pay

## 2019-05-01 DIAGNOSIS — B029 Zoster without complications: Secondary | ICD-10-CM

## 2019-05-01 MED ORDER — TRAMADOL HCL 50 MG PO TABS: 50.0000 mg | ORAL_TABLET | Freq: Four times a day (QID) | ORAL | 0 refills | Status: DC | PRN

## 2019-05-01 MED ORDER — NAPROXEN 500 MG PO TABS: 500.0000 mg | ORAL_TABLET | Freq: Two times a day (BID) | ORAL | 0 refills | Status: DC

## 2019-05-01 MED ORDER — VALACYCLOVIR HCL 1 G PO TABS: 1000.0000 mg | ORAL_TABLET | Freq: Three times a day (TID) | ORAL | 0 refills | Status: DC

## 2019-05-01 NOTE — ED Triage Notes (Signed)
Pt states she has the shingles and the pain is radiating  pain into her back and chest. This has been going on for 2 days.

## 2019-05-01 NOTE — Discharge Instructions (Addendum)
Be aware, pain medications may cause drowsiness. Please do not drive, operate heavy machinery or make important decisions while on this medication, it can cloud your judgement.  

## 2019-05-01 NOTE — ED Provider Notes (Signed)
[place]   [id number] 05/01/19 Arrival Time: 0936  ASSESSMENT & PLAN:  1. Herpes zoster without complication     Meds ordered this encounter  Medications  . traMADol (ULTRAM) 50 MG tablet    Sig: Take 1 tablet (50 mg total) by mouth every 6 (six) hours as needed.    Dispense:  20 tablet    Refill:  0  . naproxen (NAPROSYN) 500 MG tablet    Sig: Take 1 tablet (500 mg total) by mouth 2 (two) times daily.    Dispense:  30 tablet    Refill:  0  . valACYclovir (VALTREX) 1000 MG tablet    Sig: Take 1 tablet (1,000 mg total) by mouth 3 (three) times daily.    Dispense:  21 tablet    Refill:  0   [place] Controlled Substances Registry consulted for this patient. I feel the risk/benefit ratio today is favorable for proceeding with this prescription for a controlled substance. Medication sedation precautions given.  Will follow up with PCP or here if worsening or failing to improve as anticipated. Reviewed expectations re: course of current medical issues. Questions answered. Outlined signs and symptoms indicating need for more acute intervention. Patient verbalized understanding. After Visit Summary given.   SUBJECTIVE:  [name] is a 63 y.o. female who presents with a skin complaint.   Location: upper R back and side Onset: gradual Duration: first noticed about 2 d ago Associated pruritis? none Associated pain? "significant"; interferes with sleep Progression: increasing steadily  Drainage? No  Known trigger? No  New soaps/lotions/topicals/detergents/environmental exposures? No Contacts with similar? No Recent travel? No  Other associated symptoms: none Therapies tried thus far: none Arthralgia or myalgia? none Recent illness? none Fever? none No specific aggravating or alleviating factors reported.  ROS: As per HPI.  OBJECTIVE: Vitals:   05/01/19 1018 05/01/19 1019  BP:  (!) 155/90  Pulse:  76  Resp:  18  Temp:  98.7 F (37.1 C)  TempSrc:   Oral  SpO2:  97%  Weight: 81.6 kg     General appearance: alert; no distress Lungs: clear to auscultation bilaterally Heart: regular rate and rhythm Extremities: no edema Skin: warm and dry; signs of infection: no; a few crops of red/purplish papules over upper R back at midline; also near R axilla; no crusting or oozing; no bleeding Psychological: alert and cooperative; normal mood and affect  Allergies  Allergen Reactions  . Contrast Media [Iodinated Diagnostic Agents] Hives and Shortness Of Breath    Skin flushed and felt burned  . Other Other (See Comments)    trinalin - for sinus infection - keeps me awake    Past Medical History:  Diagnosis Date  . Abnormal EKG   . Diverticulitis of large intestine with perforation 02/07/2012  . GERD (gastroesophageal reflux disease)    no meds  . Heart murmur    as a child   . Hypothyroidism    hx of  15 yrs ago  . Palpitations    occasional PVCs and PACs on Holter monitor  . PONV (postoperative nausea and vomiting)    Social History   Socioeconomic History  . Marital status: Married    Spouse name: Not on file  . Number of children: Not on file  . Years of education: Not on file  . Highest education level: Not on file  Occupational History  . Not on file  Social Needs  . Financial resource strain: Not on file  .  Food insecurity    Worry: Not on file    Inability: Not on file  . Transportation needs    Medical: Not on file    Non-medical: Not on file  Tobacco Use  . Smoking status: Never Smoker  . Smokeless tobacco: Never Used  Substance and Sexual Activity  . Alcohol use: No  . Drug use: No  . Sexual activity: Not on file  Lifestyle  . Physical activity    Days per week: Not on file    Minutes per session: Not on file  . Stress: Not on file  Relationships  . Social Herbalist on phone: Not on file    Gets together: Not on file    Attends religious service: Not on file    Active member of club or  organization: Not on file    Attends meetings of clubs or organizations: Not on file    Relationship status: Not on file  . Intimate partner violence    Fear of current or ex partner: Not on file    Emotionally abused: Not on file    Physically abused: Not on file    Forced sexual activity: Not on file  Other Topics Concern  . Not on file  Social History Narrative  . Not on file   Family History  Problem Relation Age of Onset  . Diabetes Father   . Hypertension Father    Past Surgical History:  Procedure Laterality Date  . BREAST SURGERY     right benign breast lump removed   . COLOSTOMY  02/07/2012   Procedure: COLOSTOMY;  Surgeon: Gayland Curry, MD,FACS;  Location: [facility];  Service: General;  Laterality: Left;  . COLOSTOMY TAKEDOWN  09/01/2012   Procedure: LAPAROSCOPIC COLOSTOMY TAKEDOWN;  Surgeon: Gayland Curry, MD,FACS;  Location: [facility];  Service: General;  Laterality: N/A;  Lap Assisted Colostomy Reversal   . FLEXIBLE SIGMOIDOSCOPY  09/01/2012   Procedure: FLEXIBLE SIGMOIDOSCOPY;  Surgeon: Gayland Curry, MD,FACS;  Location: [facility];  Service: General;;  . HYSTEROSCOPY W/D&C N/A 07/17/2014   Procedure: DILATATION AND CURETTAGE [place];  Surgeon: Marvene Staff, MD;  Location: [facility];  Service: Gynecology;  Laterality: N/A;  . LAPAROSCOPIC SIGMOID COLECTOMY  09/01/2012   Procedure: LAPAROSCOPIC SIGMOID COLECTOMY;  Surgeon: Gayland Curry, MD,FACS;  Location: [facility];  Service: General;;  . LAPAROTOMY  02/07/2012   Procedure: EXPLORATORY LAPAROTOMY;  Surgeon: Gayland Curry, MD,FACS;  Location: [facility];  Service: General;  Laterality: Bilateral;  . NOSE SURGERY    . PARTIAL COLECTOMY  02/07/2012   Procedure: PARTIAL COLECTOMY;  Surgeon: Gayland Curry, MD,FACS;  Location: [facility];  Service: General;  Laterality: N/A;  . ROTATOR CUFF REPAIR    . TUBAL LIGATION       Vanessa Kick, MD 05/01/19 1050

## 2019-05-02 ENCOUNTER — Ambulatory Visit: Payer: Self-pay | Admitting: Family Medicine

## 2019-07-05 ENCOUNTER — Other Ambulatory Visit: Payer: Self-pay

## 2019-07-06 ENCOUNTER — Encounter: Payer: Self-pay | Admitting: Family Medicine

## 2019-07-06 ENCOUNTER — Ambulatory Visit (INDEPENDENT_AMBULATORY_CARE_PROVIDER_SITE_OTHER): Payer: Medicare Other | Admitting: Family Medicine

## 2019-07-06 DIAGNOSIS — B0229 Other postherpetic nervous system involvement: Secondary | ICD-10-CM | POA: Diagnosis not present

## 2019-07-06 MED ORDER — GABAPENTIN 300 MG PO CAPS
300.0000 mg | ORAL_CAPSULE | Freq: Two times a day (BID) | ORAL | 2 refills | Status: DC
Start: 2019-07-06 — End: 2020-12-11

## 2019-07-06 NOTE — Progress Notes (Signed)
   Subjective:    Patient ID: [name], female    DOB: 01-Nov-1956, 63 y.o.   MRN: [id number]  Patient presents for nerve pain (from chest to back and down the R arm, a little rash, headache, started May 26th, took advil)  Patient here with ongoing postherpetic neuropathic pain in the region that she had shingles.  She was seen in the urgent care on June 30 notation states that she had rash on right upper back and side.  Axilla and chest along the dermatome, she was prescribed Valtrex as well as Naprosyn and tramadol.  Tramadol that had helped.  The rash is now just hyperpigmented scarring but she still has significant nerve pain in the general region.  She would like to try gabapentin this is work for other family members have her.  Note her last visit to this office was back in August 2018    GYN- [facility] had Mammogram   Review Of Systems:  GEN- denies fatigue, fever, weight loss,weakness, recent illness HEENT- denies eye drainage, change in vision, nasal discharge, CVS- denies chest pain, palpitations RESP- denies SOB, cough, wheeze ABD- denies N/V, change in stools, abd pain GU- denies dysuria, hematuria, dribbling, incontinence MSK- denies joint pain, muscle aches, injury Neuro- denies headache, dizziness, syncope, seizure activity       Objective:    BP 132/80 (BP Location: Left Arm, Patient Position: Sitting, Cuff Size: Normal)   Pulse 94   Temp 98.5 F (36.9 C) (Oral)   Resp 18   Wt 182 lb 12.8 oz (82.9 kg)   SpO2 97%   BMI 34.54 kg/m  GEN- NAD, alert and oriented x3 HEENT- PERRL, EOMI, non injected sclera, pink conjunctiva, MMM, oropharynx clear Neck- Supple, no LAD CVS- RRR, no murmur RESP-CTAB Skin- hyperpigmented scarring right upper back, right axilla, chest along dermatome        Assessment & Plan:      Problem List Items Addressed This Visit      Unprioritized   Post herpetic neuralgia    Herpetic neuralgia.  We will start her on  gabapentin 300 mg at bedtime.  She states that she did take a couple of her husband's and tolerates this and did well with it.  Advised to wait a week if she needs to she can go up to twice a day but be mindful of any dizziness or somnolence associated with the higher dose.  She will schedule a complete physical exam we will do fasting labs at that time.         Note: This dictation was prepared with Dragon dictation along with smaller phrase technology. Any transcriptional errors that result from this process are unintentional.

## 2019-07-06 NOTE — Assessment & Plan Note (Signed)
Herpetic neuralgia.  We will start her on gabapentin 300 mg at bedtime.  She states that she did take a couple of her husband's and tolerates this and did well with it.  Advised to wait a week if she needs to she can go up to twice a day but be mindful of any dizziness or somnolence associated with the higher dose.  She will schedule a complete physical exam we will do fasting labs at that time.

## 2019-07-06 NOTE — Patient Instructions (Signed)
Schedule Physical  Start gabapentin 300mg  at bedtime, can increase to 300mg  twice a day if needed

## 2019-07-10 ENCOUNTER — Telehealth: Payer: Self-pay | Admitting: *Deleted

## 2019-07-10 DIAGNOSIS — K824 Cholesterolosis of gallbladder: Secondary | ICD-10-CM

## 2019-07-10 NOTE — Telephone Encounter (Signed)
Okay to get [place] It has been 5 years since last scan Orders placed

## 2019-07-10 NOTE — Telephone Encounter (Signed)
Received VM from patient.   Requested MD to recommend if follow up US of gallbladder is needed for gallbladder polyp.   MD please advise.

## 2019-07-18 ENCOUNTER — Ambulatory Visit
Admission: RE | Admit: 2019-07-18 | Discharge: 2019-07-18 | Disposition: A | Discharge status: Home / Self Care | Payer: Medicare Other | Source: Ambulatory Visit | Attending: Family Medicine | Admitting: Family Medicine

## 2019-07-18 DIAGNOSIS — K824 Cholesterolosis of gallbladder: Secondary | ICD-10-CM

## 2019-08-22 ENCOUNTER — Encounter: Payer: Self-pay | Admitting: Family Medicine

## 2019-08-22 ENCOUNTER — Ambulatory Visit (INDEPENDENT_AMBULATORY_CARE_PROVIDER_SITE_OTHER): Payer: Medicare Other | Admitting: Family Medicine

## 2019-08-22 ENCOUNTER — Other Ambulatory Visit: Payer: Self-pay

## 2019-08-22 VITALS — BP 124/62 | HR 86 | Temp 98.5°F | Resp 14 | Ht 61.0 in | Wt 181.0 lb

## 2019-08-22 DIAGNOSIS — Z0001 Encounter for general adult medical examination with abnormal findings: Secondary | ICD-10-CM | POA: Diagnosis not present

## 2019-08-22 DIAGNOSIS — Z0000 Encounter for general adult medical examination without abnormal findings: Secondary | ICD-10-CM

## 2019-08-22 DIAGNOSIS — K760 Fatty (change of) liver, not elsewhere classified: Secondary | ICD-10-CM

## 2019-08-22 DIAGNOSIS — E669 Obesity, unspecified: Secondary | ICD-10-CM | POA: Diagnosis not present

## 2019-08-22 DIAGNOSIS — Z1159 Encounter for screening for other viral diseases: Secondary | ICD-10-CM

## 2019-08-22 DIAGNOSIS — Z8639 Personal history of other endocrine, nutritional and metabolic disease: Secondary | ICD-10-CM

## 2019-08-22 DIAGNOSIS — B0229 Other postherpetic nervous system involvement: Secondary | ICD-10-CM

## 2019-08-22 DIAGNOSIS — Z1211 Encounter for screening for malignant neoplasm of colon: Secondary | ICD-10-CM

## 2019-08-22 NOTE — Patient Instructions (Addendum)
Referral to GI  TDAP from pharmacy  Return for fasting labs- just call us  F/U 1 year for physical

## 2019-08-22 NOTE — Progress Notes (Signed)
Subjective:   Patient presents for Medicare Annual/Subsequent preventive examination.  Patient here for a welcome to Medicare examination.  Her medications and history were reviewed.   Post herpetic neuralgia, improved with gabapentin, at bedtime now sunburn    She does not have any new concerns.    Review Past Medical/Family/Social: Per EMR   Risk Factors  Current exercise habits: She is trying to stay active Dietary issues discussed: She has changed her diet significantly since she found out about the fatty liver noted on her recent ultrasound for her gallbladder.  She is cut out fried food she is only eating chicken and fish more veggies and fruit she is also cut out white foods and processed sugar  Cardiac risk factors: Obesity (BMI >= 30 kg/m2).  Fatty liver  Depression Screen  (Note: if answer to either of the following is "Yes", a more complete depression screening is indicated)  Over the past two weeks, have you felt down, depressed or hopeless? No Over the past two weeks, have you felt little interest or pleasure in doing things? No Have you lost interest or pleasure in daily life? No Do you often feel hopeless? No Do you cry easily over simple problems? No   Activities of Daily Living  In your present state of health, do you have any difficulty performing the following activities?:  Driving? No  Managing money? No  Feeding yourself? No  Getting from bed to chair? No  Climbing a flight of stairs? No  Preparing food and eating?: No  Bathing or showering? No  Getting dressed: No  Getting to the toilet? No  Using the toilet:No  Moving around from place to place: No  In the past year have you fallen or had a near fall?:No  Are you sexually active? No  Do you have more than one partner? No   Hearing Difficulties: No  Do you often ask people to speak up or repeat themselves? No  Do you experience ringing or noises in your ears? No Do you have difficulty understanding  soft or whispered voices? No  Do you feel that you have a problem with memory? No Do you often misplace items? No  Do you feel safe at home? Yes  Cognitive Testing  Alert? Yes Normal Appearance?Yes  Oriented to person? Yes Place? Yes  Time? Yes  Recall of three objects? Yes  Can perform simple calculations? Yes  Displays appropriate judgment?Yes  Can read the correct time from a watch face?Yes   List the Names of Other Physician/Practitioners you currently use:  [facility]    Screening Tests / Date Colonoscopy Due                     Zostavax due but with recent shingles and still having some neuralgia will hold off on this for a few months Mammogram  UTD  Influenza Vaccine  Due  Pneumonia- Due for prevnar 13  Tetanus/tdap  DUE Hep C screening due    ROS: GEN- denies fatigue, fever, weight loss,weakness, recent illness HEENT- denies eye drainage, change in vision, nasal discharge, CVS- denies chest pain, palpitations RESP- denies SOB, cough, wheeze ABD- denies N/V, change in stools, abd pain GU- denies dysuria, hematuria, dribbling, incontinence MSK- denies joint pain, muscle aches, injury Neuro- denies headache, dizziness, syncope, seizure activity  Physical:  GEN- NAD, alert and oriented x3 HEENT- PERRL, EOMI, non injected sclera, pink conjunctiva, MMM, oropharynx clear Neck- Supple, no thryomegaly CVS- RRR, no murmur  RESP-CTAB ABD-NABS,soft,NT,ND  EXT- No edema Pulses- Radial, DP- 2+     Assessment:    Annual wellness medicare exam   Plan:    During the course of the visit the patient was educated and counseled about appropriate screening and preventive services including:   Immunizations-Declines flu shot / prevnar 13 due  pt to get TDAP at pharmacy    Needs TSH checked history of abnormal thyroid function    audit C/FALL/DEpression screen negative   Hep C screening to be done when she returns for her fasting labs  Colonoscopy-does have  history of colostomy which was temporary in the setting of sigmoid colectomy done in 2013. Request Dr. Hilarie Fredrickson   Bone density- Nov 6th is net [facility] appt, will have done there and Mammogram   Postherpetic neuralgia we will continue with the gabapentin   Recent ultrasound showing fatty liver we were evaluating her gallbladder polyp which is stable.  Fatty liver in the setting of her obesity.  She is due for fasting labs   Pt has advanced directive      Diet review for nutrition referral? Yes ____ Not Indicated __x__  Patient Instructions (the written plan) was given to the patient.  Medicare Attestation  I have personally reviewed:  The patient's medical and social history  Their use of alcohol, tobacco or illicit drugs  Their current medications and supplements  The patient's functional ability including ADLs,fall risks, home safety risks, cognitive, and hearing and visual impairment  Diet and physical activities  Evidence for depression or mood disorders  The patient's weight, height, BMI, and visual acuity have been recorded in the chart. I have made referrals, counseling, and provided education to the patient based on review of the above and I have provided the patient with a written personalized care plan for preventive services.

## 2019-09-17 ENCOUNTER — Telehealth: Payer: Self-pay | Admitting: Gastroenterology

## 2019-09-24 LAB — HM PAP SMEAR: HM Pap smear: NEGATIVE

## 2019-10-01 ENCOUNTER — Other Ambulatory Visit: Payer: Self-pay | Admitting: Obstetrics and Gynecology

## 2019-10-01 DIAGNOSIS — E2839 Other primary ovarian failure: Secondary | ICD-10-CM

## 2019-10-12 ENCOUNTER — Encounter: Payer: Self-pay | Admitting: Internal Medicine

## 2019-10-12 ENCOUNTER — Other Ambulatory Visit: Payer: Self-pay

## 2019-10-12 ENCOUNTER — Ambulatory Visit (AMBULATORY_SURGERY_CENTER): Payer: Medicare Other | Admitting: *Deleted

## 2019-10-12 VITALS — Temp 97.7°F | Ht 61.0 in | Wt 168.0 lb

## 2019-10-12 DIAGNOSIS — Z86010 Personal history of colonic polyps: Secondary | ICD-10-CM

## 2019-10-12 DIAGNOSIS — Z1159 Encounter for screening for other viral diseases: Secondary | ICD-10-CM

## 2019-10-12 MED ORDER — NA SULFATE-K SULFATE-MG SULF 17.5-3.13-1.6 GM/177ML PO SOLN: 1.0000 | Freq: Once | ORAL | 0 refills | Status: AC

## 2019-10-12 NOTE — Progress Notes (Signed)

## 2019-10-17 ENCOUNTER — Ambulatory Visit (INDEPENDENT_AMBULATORY_CARE_PROVIDER_SITE_OTHER): Payer: Medicare Other

## 2019-10-17 DIAGNOSIS — Z1159 Encounter for screening for other viral diseases: Secondary | ICD-10-CM

## 2019-10-18 LAB — SARS CORONAVIRUS 2 (TAT 6-24 HRS): SARS Coronavirus 2: NEGATIVE

## 2019-10-22 ENCOUNTER — Ambulatory Visit (AMBULATORY_SURGERY_CENTER): Payer: Medicare Other | Admitting: Internal Medicine

## 2019-10-22 ENCOUNTER — Other Ambulatory Visit: Payer: Self-pay

## 2019-10-22 ENCOUNTER — Encounter: Payer: Self-pay | Admitting: Internal Medicine

## 2019-10-22 VITALS — BP 114/56 | HR 50 | Temp 98.0°F | Resp 14 | Ht 61.0 in | Wt 168.0 lb

## 2019-10-22 DIAGNOSIS — D128 Benign neoplasm of rectum: Secondary | ICD-10-CM

## 2019-10-22 DIAGNOSIS — D120 Benign neoplasm of cecum: Secondary | ICD-10-CM

## 2019-10-22 DIAGNOSIS — Z86010 Personal history of colonic polyps: Secondary | ICD-10-CM | POA: Diagnosis not present

## 2019-10-22 DIAGNOSIS — D122 Benign neoplasm of ascending colon: Secondary | ICD-10-CM | POA: Diagnosis not present

## 2019-10-22 DIAGNOSIS — D123 Benign neoplasm of transverse colon: Secondary | ICD-10-CM | POA: Diagnosis not present

## 2019-10-22 DIAGNOSIS — D124 Benign neoplasm of descending colon: Secondary | ICD-10-CM

## 2019-10-22 DIAGNOSIS — D125 Benign neoplasm of sigmoid colon: Secondary | ICD-10-CM

## 2019-10-22 MED ORDER — SODIUM CHLORIDE 0.9 % IV SOLN: 500.0000 mL | Freq: Once | INTRAVENOUS | Status: DC

## 2019-10-22 NOTE — Patient Instructions (Addendum)
YOU HAD AN ENDOSCOPIC PROCEDURE TODAY AT [facility]:   Refer to the procedure report that was given to you for any specific questions about what was found during the examination.  If the procedure report does not answer your questions, please call your gastroenterologist to clarify.  If you requested that your care partner not be given the details of your procedure findings, then the procedure report has been included in a sealed envelope for you to review at your convenience later.  YOU SHOULD EXPECT: Some feelings of bloating in the abdomen. Passage of more gas than usual.  Walking can help get rid of the air that was put into your GI tract during the procedure and reduce the bloating. If you had a lower endoscopy (such as a colonoscopy or flexible sigmoidoscopy) you may notice spotting of blood in your stool or on the toilet paper. If you underwent a bowel prep for your procedure, you may not have a normal bowel movement for a few days.  Please Note:  You might notice some irritation and congestion in your nose or some drainage.  This is from the oxygen used during your procedure.  There is no need for concern and it should clear up in a day or so.  SYMPTOMS TO REPORT IMMEDIATELY:   Following lower endoscopy (colonoscopy or flexible sigmoidoscopy):  Excessive amounts of blood in the stool  Significant tenderness or worsening of abdominal pains  Swelling of the abdomen that is new, acute  Fever of 100F or higher    For urgent or emergent issues, a gastroenterologist can be reached at any hour by calling [phone].   DIET:  We do recommend a small meal at first, but then you may proceed to your regular diet.  Drink plenty of fluids but you should avoid alcoholic beverages for 24 hours.  ACTIVITY:  You should plan to take it easy for the rest of today and you should NOT DRIVE or use heavy machinery until tomorrow (because of the sedation medicines used during the test).     FOLLOW UP: Our staff will call the number listed on your records 48-72 hours following your procedure to check on you and address any questions or concerns that you may have regarding the information given to you following your procedure. If we do not reach you, we will leave a message.  We will attempt to reach you two times.  During this call, we will ask if you have developed any symptoms of COVID 19. If you develop any symptoms (ie: fever, flu-like symptoms, shortness of breath, cough etc.) before then, please call [phone].  If you test positive for Covid 19 in the 2 weeks post procedure, please call and report this information to [place].    If any biopsies were taken you will be contacted by phone or by letter within the next 1-3 weeks.  Please call us at [phone] if you have not heard about the biopsies in 3 weeks.    SIGNATURES/CONFIDENTIALITY: You and/or your care partner have signed paperwork which will be entered into your electronic medical record.  These signatures attest to the fact that that the information above on your After Visit Summary has been reviewed and is understood.  Full responsibility of the confidentiality of this discharge information lies with you and/or your care-partner.    Handouts were given to your care partner on polyps, diverticulosis, and hemorrhoids. NO ASPIRIN, ASPIRIN CONTAINING PRODUCTS (BC OR GOODY POWDERS) OR NSAIDS (  IBUPROFEN, ADVIL, ALEVE, AND MOTRIN) FOR 2 weeks; TYLENOL IS OK TO TAKE. You may resume your other current medications today. Await biopsy results. Please call if any questions or concerns.

## 2019-10-22 NOTE — Progress Notes (Signed)
To PACU, VSS. Report to [email or web address] 

## 2019-10-22 NOTE — Progress Notes (Signed)
Temp-LC VS-CW  Pt's states no medical or surgical changes since previsit or office visit.  

## 2019-10-22 NOTE — Op Note (Signed)
[facility] Patient Name: [name] Procedure Date: 10/22/2019 8:51 AM MRN: [id number] Endoscopist: Jerene Bears , MD Age: 63 Referring MD:  Date of Birth: 1956/01/06 Gender: Female Account #: [id number] Procedure:                Colonoscopy Indications:              High risk colon cancer surveillance: Personal                            history of non-advanced adenoma, Last colonoscopy:                            2014 Medicines:                Monitored Anesthesia Care Procedure:                Pre-Anesthesia Assessment:                           - Prior to the procedure, a History and Physical                            was performed, and patient medications and                            allergies were reviewed. The patient's tolerance of                            previous anesthesia was also reviewed. The risks                            and benefits of the procedure and the sedation                            options and risks were discussed with the patient.                            All questions were answered, and informed consent                            was obtained. Prior Anticoagulants: The patient has                            taken no previous anticoagulant or antiplatelet                            agents. ASA Grade Assessment: II - A patient with                            mild systemic disease. After reviewing the risks                            and benefits, the patient was deemed in  satisfactory condition to undergo the procedure.                           After obtaining informed consent, the colonoscope                            was passed under direct vision. Throughout the                            procedure, the patient's blood pressure, pulse, and                            oxygen saturations were monitored continuously. The                            Colonoscope was introduced through the anus and                             advanced to the cecum, identified by appendiceal                            orifice and ileocecal valve. The colonoscopy was                            performed without difficulty. The patient tolerated                            the procedure well. The quality of the bowel                            preparation was good. The ileocecal valve,                            appendiceal orifice, and rectum were photographed. Scope In: 8:54:03 AM Scope Out: 9:17:26 AM Scope Withdrawal Time: 0 hours 20 minutes 33 seconds  Total Procedure Duration: 0 hours 23 minutes 23 seconds  Findings:                 The digital rectal exam was normal.                           A 3 mm polyp was found in the cecum. The polyp was                            sessile. The polyp was removed with a cold snare.                            Resection and retrieval were complete.                           A 5 mm polyp was found in the ascending colon. The                            polyp was sessile.  The polyp was removed with a                            cold snare. Resection and retrieval were complete.                           Three sessile polyps were found in the transverse                            colon. The polyps were 4 to 6 mm in size. These                            polyps were removed with a cold snare. Resection                            and retrieval were complete.                           A 5 mm polyp was found in the descending colon. The                            polyp was sessile. The polyp was removed with a                            cold snare. Resection and retrieval were complete.                           A 15 mm polyp was found in the descending colon.                            The polyp was semi-pedunculated. The polyp was                            removed with a hot snare. Resection and retrieval                            were complete.                           Two  sessile polyps were found in the sigmoid colon.                            The polyps were 4 to 5 mm in size. These polyps                            were removed with a cold snare. Resection and                            retrieval were complete.                           Two sessile polyps were found in the rectum. The  polyps were 2 to 3 mm in size. These polyps were                            removed with a cold snare. Resection and retrieval                            were complete.                           Multiple small and large-mouthed diverticula were                            found in the sigmoid colon, descending colon,                            transverse colon, ascending colon and cecum.                           There was evidence of a prior end-to-end                            colo-colonic anastomosis in the distal sigmoid                            colon. This was patent and was characterized by                            healthy appearing mucosa.                           Internal hemorrhoids were found during                            retroflexion. The hemorrhoids were small. Complications:            No immediate complications. Estimated Blood Loss:     Estimated blood loss was minimal. Impression:               - One 3 mm polyp in the cecum, removed with a cold                            snare. Resected and retrieved.                           - One 5 mm polyp in the ascending colon, removed                            with a cold snare. Resected and retrieved.                           - Three 4 to 6 mm polyps in the transverse colon,                            removed with a cold snare. Resected and retrieved.                           -  One 5 mm polyp in the descending colon, removed                            with a cold snare. Resected and retrieved.                           - One 15 mm polyp in the descending colon, removed                             with a hot snare. Resected and retrieved.                           - Two 4 to 5 mm polyps in the sigmoid colon,                            removed with a cold snare. Resected and retrieved.                           - Two 2 to 3 mm polyps in the rectum, removed with                            a cold snare. Resected and retrieved.                           - Diverticulosis in the sigmoid colon, in the                            descending colon, in the transverse colon, in the                            ascending colon and in the cecum.                           - Patent end-to-end colo-colonic anastomosis,                            characterized by healthy appearing mucosa.                           - Small internal hemorrhoids. Recommendation:           - Patient has a contact number available for                            emergencies. The signs and symptoms of potential                            delayed complications were discussed with the                            patient. Return to normal activities tomorrow.                            Written discharge  instructions were provided to the                            patient.                           - Resume previous diet.                           - Continue present medications.                           - Await pathology results.                           - Repeat colonoscopy is recommended for                            surveillance. The colonoscopy date will be                            determined after pathology results from today's                            exam become available for review.                           - No ibuprofen, naproxen, or other non-steroidal                            anti-inflammatory drugs for 2 weeks after polyp                            removal. Jerene Bears, MD 10/22/2019 9:23:42 AM This report has been signed electronically.

## 2019-10-22 NOTE — Progress Notes (Signed)
Called to room to assist during endoscopic procedure.  Patient ID and intended procedure confirmed with present staff. Received instructions for my participation in the procedure from the performing physician.  

## 2019-10-22 NOTE — Progress Notes (Addendum)
No problems noted in the recovery room. Maw  Pt was tearfull as she spoke of the sister-law that Dr. Hilarie Fredrickson took carfe of with cirrhosis and she is deceased.  She spoke very highly of Dr. Hilarie Fredrickson.  maw

## 2019-10-24 ENCOUNTER — Encounter: Payer: Self-pay | Admitting: Internal Medicine

## 2019-10-24 ENCOUNTER — Telehealth: Payer: Self-pay | Admitting: *Deleted

## 2019-10-24 NOTE — Telephone Encounter (Signed)
  Follow up Call-  Call back number 10/22/2019  Post procedure Call Back phone  # [phone]  Permission to leave phone message Yes  Some recent data might be hidden     Patient questions:  Do you have a fever, pain , or abdominal swelling? No. Pain Score  0 *  Have you tolerated food without any problems? Yes.    Have you been able to return to your normal activities? Yes.    Do you have any questions about your discharge instructions: Diet   No. Medications  No. Follow up visit  No.  Do you have questions or concerns about your Care? No.  Actions: * If pain score is 4 or above: No action needed, pain <4.  1. Have you developed a fever since your procedure? no  2.   Have you had an respiratory symptoms (SOB or cough) since your procedure? no  3.   Have you tested positive for COVID 19 since your procedure no  4.   Have you had any family members/close contacts diagnosed with the COVID 19 since your procedure?  no   If yes to any of these questions please route to Joylene John, RN and Alphonsa Gin, Therapist, sports.

## 2019-10-24 NOTE — Telephone Encounter (Signed)
  Follow up Call-  Call back number 10/22/2019  Post procedure Call Back phone  # [phone]  Permission to leave phone message Yes  Some recent data might be hidden     Patient questions:  Message left to call us if necessary.

## 2020-01-01 NOTE — Telephone Encounter (Signed)
Pt seen on 10/22/2019

## 2020-01-11 ENCOUNTER — Other Ambulatory Visit: Payer: Medicare Other

## 2020-02-14 ENCOUNTER — Other Ambulatory Visit: Payer: Self-pay

## 2020-02-14 ENCOUNTER — Ambulatory Visit
Admission: RE | Admit: 2020-02-14 | Discharge: 2020-02-14 | Disposition: A | Discharge status: Home / Self Care | Payer: Medicare PPO | Source: Ambulatory Visit | Attending: Obstetrics and Gynecology | Admitting: Obstetrics and Gynecology

## 2020-02-14 DIAGNOSIS — E2839 Other primary ovarian failure: Secondary | ICD-10-CM

## 2020-10-28 ENCOUNTER — Encounter: Payer: Self-pay | Admitting: Internal Medicine

## 2020-12-11 ENCOUNTER — Other Ambulatory Visit: Payer: Self-pay

## 2020-12-11 ENCOUNTER — Ambulatory Visit (AMBULATORY_SURGERY_CENTER): Payer: Self-pay | Admitting: *Deleted

## 2020-12-11 VITALS — Ht 61.0 in | Wt 150.0 lb

## 2020-12-11 DIAGNOSIS — Z01818 Encounter for other preprocedural examination: Secondary | ICD-10-CM

## 2020-12-11 DIAGNOSIS — Z86010 Personal history of colonic polyps: Secondary | ICD-10-CM

## 2020-12-11 MED ORDER — SUPREP BOWEL PREP KIT 17.5-3.13-1.6 GM/177ML PO SOLN: 1.0000 | Freq: Once | ORAL | 0 refills | Status: AC

## 2020-12-11 NOTE — Progress Notes (Signed)
No egg allergy-- some soy issues- sweling in throat and itchy eyes  known to patient - has 10-2019 Propofol with NO issues   No issues with past sedation with any surgeries or procedures  No intubation problems in the past  No FH of Malignant Hyperthermia No diet pills per patient No home 02 use per patient  No blood thinners per patient  Pt denies issues with constipation  No A fib or A flutter  EMMI video to pt or via MyChart  COVID 19 guidelines implemented in [facility] today with Pt and RN  Pt is not  vaccinated  for Covid - covid test 2-21 at 1130 am   Pt denies loose or missing teeth, denies dentures, partials, dental implants, no bonded teeth - pt has capped back teeth    NO PA's for preps discussed with pt In [facility] today  Discussed with pt there will be an out-of-pocket cost for prep and that varies from $0 to 70 dollars   Due to the COVID-19 pandemic we are asking patients to follow certain guidelines.  Pt aware of COVID protocols and [facility] guidelines

## 2020-12-22 ENCOUNTER — Other Ambulatory Visit: Payer: Self-pay | Admitting: Internal Medicine

## 2020-12-23 LAB — SARS CORONAVIRUS 2 (TAT 6-24 HRS): SARS Coronavirus 2: NEGATIVE

## 2020-12-25 ENCOUNTER — Other Ambulatory Visit: Payer: Self-pay

## 2020-12-25 ENCOUNTER — Encounter: Payer: Self-pay | Admitting: Internal Medicine

## 2020-12-25 ENCOUNTER — Ambulatory Visit (AMBULATORY_SURGERY_CENTER): Payer: Medicare PPO | Admitting: Internal Medicine

## 2020-12-25 VITALS — BP 115/58 | HR 49 | Temp 97.3°F | Resp 10 | Ht 61.0 in | Wt 150.0 lb

## 2020-12-25 DIAGNOSIS — Z86010 Personal history of colonic polyps: Secondary | ICD-10-CM | POA: Diagnosis not present

## 2020-12-25 DIAGNOSIS — D123 Benign neoplasm of transverse colon: Secondary | ICD-10-CM

## 2020-12-25 DIAGNOSIS — K635 Polyp of colon: Secondary | ICD-10-CM | POA: Diagnosis not present

## 2020-12-25 MED ORDER — SODIUM CHLORIDE 0.9 % IV SOLN: 500.0000 mL | Freq: Once | INTRAVENOUS | Status: DC

## 2020-12-25 NOTE — Patient Instructions (Signed)
Handouts on polyps and diverticulosis given to you today  °Await pathology results  ° °YOU HAD AN ENDOSCOPIC PROCEDURE TODAY AT THE  ENDOSCOPY CENTER:   Refer to the procedure report that was given to you for any specific questions about what was found during the examination.  If the procedure report does not answer your questions, please call your gastroenterologist to clarify.  If you requested that your care partner not be given the details of your procedure findings, then the procedure report has been included in a sealed envelope for you to review at your convenience later. ° °YOU SHOULD EXPECT: Some feelings of bloating in the abdomen. Passage of more gas than usual.  Walking can help get rid of the air that was put into your GI tract during the procedure and reduce the bloating. If you had a lower endoscopy (such as a colonoscopy or flexible sigmoidoscopy) you may notice spotting of blood in your stool or on the toilet paper. If you underwent a bowel prep for your procedure, you may not have a normal bowel movement for a few days. ° °Please Note:  You might notice some irritation and congestion in your nose or some drainage.  This is from the oxygen used during your procedure.  There is no need for concern and it should clear up in a day or so. ° °SYMPTOMS TO REPORT IMMEDIATELY: ° °Following lower endoscopy (colonoscopy or flexible sigmoidoscopy): ° Excessive amounts of blood in the stool ° Significant tenderness or worsening of abdominal pains ° Swelling of the abdomen that is new, acute ° Fever of 100°F or higher ° °For urgent or emergent issues, a gastroenterologist can be reached at any hour by calling [phone]. °Do not use MyChart messaging for urgent concerns.  ° ° °DIET:  We do recommend a small meal at first, but then you may proceed to your regular diet.  Drink plenty of fluids but you should avoid alcoholic beverages for 24 hours. ° °ACTIVITY:  You should plan to take it easy for the  rest of today and you should NOT DRIVE or use heavy machinery until tomorrow (because of the sedation medicines used during the test).   ° °FOLLOW UP: °Our staff will call the number listed on your records 48-72 hours following your procedure to check on you and address any questions or concerns that you may have regarding the information given to you following your procedure. If we do not reach you, we will leave a message.  We will attempt to reach you two times.  During this call, we will ask if you have developed any symptoms of COVID 19. If you develop any symptoms (ie: fever, flu-like symptoms, shortness of breath, cough etc.) before then, please call [phone].  If you test positive for Covid 19 in the 2 weeks post procedure, please call and report this information to us.   ° °If any biopsies were taken you will be contacted by phone or by letter within the next 1-3 weeks.  Please call us at [phone] if you have not heard about the biopsies in 3 weeks.  ° ° °SIGNATURES/CONFIDENTIALITY: °You and/or your care partner have signed paperwork which will be entered into your electronic medical record.  These signatures attest to the fact that that the information above on your After Visit Summary has been reviewed and is understood.  Full responsibility of the confidentiality of this discharge information lies with you and/or your care-partner.  °

## 2020-12-25 NOTE — Progress Notes (Signed)
Called to room to assist during endoscopic procedure.  Patient ID and intended procedure confirmed with present staff. Received instructions for my participation in the procedure from the performing physician.  

## 2020-12-25 NOTE — Progress Notes (Signed)
Pt's states no medical or surgical changes since previsit or office visit.  VS SB

## 2020-12-25 NOTE — Op Note (Signed)
[facility] Patient Name: [name] Procedure Date: 12/25/2020 11:39 AM MRN: [id number] Endoscopist: Jerene Bears , MD Age: 65 Referring MD:  Date of Birth: 1956-02-22 Gender: Female Account #: [id number] Procedure:                Colonoscopy Indications:              High risk colon cancer surveillance: Personal                            history of multiple (11) adenomas and sessile                            serrated polyp at last colonoscopy: December 2020 Medicines:                Monitored Anesthesia Care Procedure:                Pre-Anesthesia Assessment:                           - Prior to the procedure, a History and Physical                            was performed, and patient medications and                            allergies were reviewed. The patient's tolerance of                            previous anesthesia was also reviewed. The risks                            and benefits of the procedure and the sedation                            options and risks were discussed with the patient.                            All questions were answered, and informed consent                            was obtained. Prior Anticoagulants: The patient has                            taken no previous anticoagulant or antiplatelet                            agents. ASA Grade Assessment: II - A patient with                            mild systemic disease. After reviewing the risks                            and benefits, the patient was deemed in  satisfactory condition to undergo the procedure.                           After obtaining informed consent, the colonoscope                            was passed under direct vision. Throughout the                            procedure, the patient's blood pressure, pulse, and                            oxygen saturations were monitored continuously. The                            Olympus PFC-[id number]  (#[id number]) Colonoscope was                            introduced through the anus and advanced to the                            cecum, identified by appendiceal orifice and                            ileocecal valve. The colonoscopy was performed                            without difficulty. The patient tolerated the                            procedure well. The quality of the bowel                            preparation was good. The ileocecal valve,                            appendiceal orifice, and rectum were photographed. Scope In: 11:45:35 AM Scope Out: 12:00:37 PM Scope Withdrawal Time: 0 hours 13 minutes 6 seconds  Total Procedure Duration: 0 hours 15 minutes 2 seconds  Findings:                 The digital rectal exam was normal.                           A 6 mm polyp was found in the hepatic flexure. The                            polyp was sessile. The polyp was removed with a                            cold snare. Resection and retrieval were complete.                           Multiple small and large-mouthed diverticula were  found from cecum to sigmoid colon.                           There was evidence of a prior end-to-end                            colo-colonic anastomosis in the distal sigmoid                            colon. This was patent and was characterized by                            healthy appearing mucosa.                           The retroflexed view of the distal rectum and anal                            verge was normal and showed no anal or rectal                            abnormalities. Complications:            No immediate complications. Estimated Blood Loss:     Estimated blood loss was minimal. Impression:               - One 6 mm polyp at the hepatic flexure, removed                            with a cold snare. Resected and retrieved.                           - Diverticulosis from cecum to sigmoid colon.                            - Patent end-to-end colo-colonic anastomosis,                            characterized by healthy appearing mucosa.                           - The distal rectum and anal verge are normal on                            retroflexion view. Recommendation:           - Patient has a contact number available for                            emergencies. The signs and symptoms of potential                            delayed complications were discussed with the                            patient. Return to  normal activities tomorrow.                            Written discharge instructions were provided to the                            patient.                           - Resume previous diet.                           - Continue present medications.                           - Await pathology results.                           - Repeat colonoscopy is recommended for                            surveillance likely in 3 years. The colonoscopy                            date will be determined after pathology results                            from today's exam become available for review. Jerene Bears, MD 12/25/2020 12:09:31 PM This report has been signed electronically.

## 2020-12-25 NOTE — Progress Notes (Signed)
Report to PACU, RN, vss, BBS= Clear.  

## 2020-12-29 ENCOUNTER — Telehealth: Payer: Self-pay | Admitting: *Deleted

## 2020-12-29 NOTE — Telephone Encounter (Signed)
Follow up call made. 

## 2020-12-29 NOTE — Telephone Encounter (Signed)
Attempted 2nd f/u phone call. No answer. Left message.  °

## 2020-12-31 ENCOUNTER — Telehealth: Payer: Self-pay | Admitting: Internal Medicine

## 2020-12-31 ENCOUNTER — Encounter: Payer: Self-pay | Admitting: Internal Medicine

## 2020-12-31 NOTE — Telephone Encounter (Signed)
Patient called for path results 

## 2020-12-31 NOTE — Telephone Encounter (Signed)
Result letter discussed with pt over the phone.

## 2023-05-16 ENCOUNTER — Encounter: Payer: Self-pay | Admitting: Family Medicine

## 2023-05-16 ENCOUNTER — Ambulatory Visit: Payer: Medicare PPO | Admitting: Family Medicine

## 2023-05-16 VITALS — BP 120/80 | HR 71 | Temp 98.5°F | Ht 61.0 in | Wt 180.0 lb

## 2023-05-16 DIAGNOSIS — K824 Cholesterolosis of gallbladder: Secondary | ICD-10-CM

## 2023-05-16 DIAGNOSIS — Z1159 Encounter for screening for other viral diseases: Secondary | ICD-10-CM

## 2023-05-16 DIAGNOSIS — E669 Obesity, unspecified: Secondary | ICD-10-CM | POA: Diagnosis not present

## 2023-05-16 DIAGNOSIS — E039 Hypothyroidism, unspecified: Secondary | ICD-10-CM

## 2023-05-16 DIAGNOSIS — E785 Hyperlipidemia, unspecified: Secondary | ICD-10-CM | POA: Insufficient documentation

## 2023-05-16 DIAGNOSIS — Z91041 Radiographic dye allergy status: Secondary | ICD-10-CM

## 2023-05-16 MED ORDER — SCOPOLAMINE 1 MG/3DAYS TD PT72: 1.0000 | MEDICATED_PATCH | TRANSDERMAL | 12 refills | Status: DC

## 2023-05-16 MED ORDER — EPINEPHRINE 0.3 MG/0.3ML IJ SOAJ: 0.3000 mg | INTRAMUSCULAR | 0 refills | Status: AC | PRN

## 2023-05-16 NOTE — Assessment & Plan Note (Addendum)
History of gallbladder polyp that has not been evaluated in many years, she requested repeat ultrasound as recommended by previous PCP. No concerning symptoms

## 2023-05-16 NOTE — Progress Notes (Signed)
New Patient Office Visit  Subjective    Patient ID: [name], female    DOB: [birth year]  Age: 67 y.o. MRN: [id number]  CC: No chief complaint on file.   HPI [name] presents to establish care. Oriented to practice routines and expectations. She has not seen a PCP in several years. PMH as below. She does see OBGYN and dermatology as well as Podiatry for Orthotics and a Land.  Breast CA screening: Mammogram status: Completed 2023. Repeat every year. Scheduled for August 1 Cervical CA screening:  Scheduled for August 1 at [facility] Colon CA screening: colonoscopy due 12/26/2023 DEXA: DEXA scan, previous DEXA scan T-score was minus 2.2, will have repeat this year. Tobacco: non-smoker Vaccines:  declines     Outpatient Encounter Medications as of 05/16/2023  Medication Sig   Ascorbic Acid (VITAMIN C) 1000 MG tablet Take 1,000 mg by mouth 2 (two) times daily.   ASHWAGANDHA PO Take 3,000 mg by mouth.   B Complex Vitamins (B COMPLEX PO) Take by mouth.   Biotin 5000 MCG TABS Take by mouth.   Calcium Carbonate+Vitamin D 600-200 MG-UNIT TABS Take by mouth.   Cholecalciferol (VITAMIN D3) 125 MCG (5000 UT) CAPS Take 1,000 Units by mouth.   COLLAGEN PO Take by mouth. Grassfed and Pasture raised healthy Gut and immune and Joints 11 grams daily   MAGNESIUM PO Take 240 mg by mouth daily.   Multiple Vitamin (MULTIVITAMIN) tablet Take 1 tablet by mouth daily.   Omega-3 Fatty Acids (FISH OIL) 1000 MG CAPS Take by mouth.   Probiotic Product (PROBIOTIC DAILY PO) Take 1 capsule by mouth daily. Prescript-assist --- broad spectrum probiotic & prebiotic combination   QUERCETIN PO Take by mouth.   scopolamine (TRANSDERM-SCOP) 1 MG/3DAYS Place 1 patch (1.5 mg total) onto the skin every 3 (three) days.   Turmeric (QC TUMERIC COMPLEX) 500 MG CAPS Take 2,250 mg by mouth.   Zinc 15 MG CAPS Take by mouth.   EPINEPHrine (EPIPEN 2-PAK) 0.3 mg/0.3 mL IJ SOAJ injection Inject 0.3 mg  into the muscle as needed for anaphylaxis. INJECT INTO THE MUSCLE ONCE AS NEEDED FOR ALLERGIC REACTION THEN PROCEED TO THE ED   [DISCONTINUED] EPIPEN 2-PAK 0.3 MG/0.3ML SOAJ injection INJECT INTO THE MUSCLE ONCE AS NEEDED FOR ALLERGIC REACTION THEN PROCEED TO THE ED (Patient not taking: Reported on 12/25/2020)   [DISCONTINUED] Flaxseed, Linseed, (FLAX SEED OIL PO) Take 1 tablet by mouth daily.   [DISCONTINUED] Melatonin 1 MG CAPS Take 2 capsules by mouth.   [DISCONTINUED] milk thistle 175 MG tablet Take 175 mg by mouth daily. (Patient not taking: No sig reported)   [DISCONTINUED] naproxen (NAPROSYN) 500 MG tablet Take 1 tablet (500 mg total) by mouth 2 (two) times daily. (Patient not taking: No sig reported)   [DISCONTINUED] OVER THE COUNTER MEDICATION Protein Powder (Patient not taking: No sig reported)   [DISCONTINUED] Selenium 200 MCG CAPS Take by mouth.   [DISCONTINUED] VITAMIN A PO Take by mouth.   No facility-administered encounter medications on file as of 05/16/2023.    Past Medical History:  Diagnosis Date   Abnormal EKG    Allergy    various environmental    Diverticulitis of large intestine with perforation 02/07/2012   GERD (gastroesophageal reflux disease)    no meds- past hx    Heart murmur    as a child    Hyperlipidemia    has been borderline    Hypothyroidism    hx of  15 yrs ago   Palpitations    occasional PVCs and PACs on Holter monitor   PONV (postoperative nausea and vomiting)     Past Surgical History:  Procedure Laterality Date   BREAST SURGERY     right benign breast lump removed    COLONOSCOPY     COLOSTOMY  02/07/2012   Procedure: COLOSTOMY;  Surgeon: Atilano Ina, MD,FACS;  Location: [facility];  Service: General;  Laterality: Left;   COLOSTOMY TAKEDOWN  09/01/2012   Procedure: LAPAROSCOPIC COLOSTOMY TAKEDOWN;  Surgeon: Atilano Ina, MD,FACS;  Location: [facility];  Service: General;  Laterality: N/A;  Lap Assisted Colostomy Reversal    FLEXIBLE SIGMOIDOSCOPY   09/01/2012   Procedure: FLEXIBLE SIGMOIDOSCOPY;  Surgeon: Atilano Ina, MD,FACS;  Location: [facility];  Service: General;;   HYSTEROSCOPY WITH D & C N/A 07/17/2014   Procedure: DILATATION AND CURETTAGE Melton Krebs;  Surgeon: Serita Kyle, MD;  Location: [facility];  Service: Gynecology;  Laterality: N/A;   LAPAROSCOPIC SIGMOID COLECTOMY  09/01/2012   Procedure: LAPAROSCOPIC SIGMOID COLECTOMY;  Surgeon: Atilano Ina, MD,FACS;  Location: [facility];  Service: General;;   LAPAROTOMY  02/07/2012   Procedure: EXPLORATORY LAPAROTOMY;  Surgeon: Atilano Ina, MD,FACS;  Location: [facility];  Service: General;  Laterality: Bilateral;   NOSE SURGERY     PARTIAL COLECTOMY  02/07/2012   Procedure: PARTIAL COLECTOMY;  Surgeon: Atilano Ina, MD,FACS;  Location: [facility];  Service: General;  Laterality: N/A;   POLYPECTOMY     ROTATOR CUFF REPAIR     SIGMOIDOSCOPY     TUBAL LIGATION      Family History  Problem Relation Age of Onset   Diverticulosis Mother    Crohn's disease Mother    Diabetes Father    Hypertension Father    Colon cancer Neg Hx    Esophageal cancer Neg Hx    Stomach cancer Neg Hx    Rectal cancer Neg Hx    Colon polyps Neg Hx     Social History   Socioeconomic History   Marital status: Married    Spouse name: Not on file   Number of children: Not on file   Years of education: Not on file   Highest education level: Not on file  Occupational History   Not on file  Tobacco Use   Smoking status: Never   Smokeless tobacco: Never  Vaping Use   Vaping status: Never Used  Substance and Sexual Activity   Alcohol use: No   Drug use: No   Sexual activity: Not on file  Other Topics Concern   Not on file  Social History Narrative   Not on file   Social Determinants of Health   Financial Resource Strain: Not on file  Food Insecurity: Not on file  Transportation Needs: Not on file  Physical Activity: Not on file  Stress: Not on file  Social Connections: Not on file  Intimate  Partner Violence: Not on file    Review of Systems  Constitutional: Negative.   HENT: Negative.    Eyes: Negative.   Respiratory: Negative.    Cardiovascular: Negative.   Gastrointestinal: Negative.   Genitourinary: Negative.   Musculoskeletal: Negative.   Skin: Negative.   Neurological: Negative.   Endo/Heme/Allergies: Negative.   Psychiatric/Behavioral: Negative.    All other systems reviewed and are negative.       Objective    BP 120/80   Pulse 71   Temp 98.5 F (36.9 C) (Oral)  Ht 5\' 1"  (1.549 m)   Wt 180 lb (81.6 kg)   SpO2 99%   BMI 34.01 kg/m   Physical Exam Vitals and nursing note reviewed.  Constitutional:      Appearance: Normal appearance. She is normal weight.  HENT:     Head: Normocephalic and atraumatic.  Cardiovascular:     Rate and Rhythm: Normal rate and regular rhythm.     Pulses: Normal pulses.     Heart sounds: Normal heart sounds.  Pulmonary:     Effort: Pulmonary effort is normal.     Breath sounds: Normal breath sounds.  Skin:    General: Skin is warm and dry.  Neurological:     General: No focal deficit present.     Mental Status: She is alert and oriented to person, place, and time. Mental status is at baseline.  Psychiatric:        Mood and Affect: Mood normal.        Behavior: Behavior normal.        Thought Content: Thought content normal.        Judgment: Judgment normal.         Assessment & Plan:   Problem List Items Addressed This Visit     History of allergy to radiographic contrast media (Chronic)    Epipen refill provided.      Obesity (BMI 30.0-34.9) - Primary    Patinet is currently working on diet and weight loss program. Encourage heart healthy diet and 150 minutes moderate intensity exercise weekly. Fasting labs ordered.      Relevant Orders   CBC with Differential/Platelet   COMPLETE METABOLIC PANEL WITH GFR   Lipid panel   TSH   Hypothyroidism    Unmedicated, will obtain TSH      Relevant  Orders   TSH   Hyperlipidemia    She is working on diet and weight loss. Fasting labs ordered.      Relevant Medications   EPINEPHrine (EPIPEN 2-PAK) 0.3 mg/0.3 mL IJ SOAJ injection   Gallbladder polyp    History of gallbladder polyp that has not been evaluated in many years, she requested repeat ultrasound as recommended by previous PCP. No concerning symptoms      Relevant Orders   US Abdomen Limited RUQ (LIVER/GB)   Other Visit Diagnoses     Need for hepatitis C screening test       Relevant Orders   Hepatitis C antibody       Return in about 1 year (around 05/15/2024) for annual physical.   Park Meo, FNP

## 2023-05-16 NOTE — Assessment & Plan Note (Signed)
Patinet is currently working on diet and weight loss program. Encourage heart healthy diet and 150 minutes moderate intensity exercise weekly. Fasting labs ordered.

## 2023-05-16 NOTE — Assessment & Plan Note (Signed)
Epipen refill provided.

## 2023-05-16 NOTE — Assessment & Plan Note (Signed)
Unmedicated, will obtain TSH

## 2023-05-16 NOTE — Assessment & Plan Note (Signed)
She is working on diet and weight loss. Fasting labs ordered.

## 2023-05-17 ENCOUNTER — Ambulatory Visit: Payer: Medicare PPO | Admitting: Podiatry

## 2023-05-17 ENCOUNTER — Encounter: Payer: Self-pay | Admitting: Podiatry

## 2023-05-17 DIAGNOSIS — M2170 Unequal limb length (acquired), unspecified site: Secondary | ICD-10-CM | POA: Diagnosis not present

## 2023-05-17 DIAGNOSIS — M722 Plantar fascial fibromatosis: Secondary | ICD-10-CM | POA: Diagnosis not present

## 2023-05-17 NOTE — Progress Notes (Signed)
Chief Complaint  Patient presents with   Foot Orthotics    Requesting new orthotics - back issues and chiropractor recommended, had her last pair 30 years ago   New Patient (Initial Visit)   HPI: 67 y.o. female presents today with request for new orthotics.  She states that she has an old pair from almost 30 years ago.  She sees a chiropractor regularly for issues with her lower back.  She does note that she has scoliosis.  She also notes that her chiropractor has told her that she has shorter limb  Past Medical History:  Diagnosis Date   Abnormal EKG    Allergy    various environmental    Diverticulitis of large intestine with perforation 02/07/2012   GERD (gastroesophageal reflux disease)    no meds- past hx    Heart murmur    as a child    Hyperlipidemia    has been borderline    Hypothyroidism    hx of  15 yrs ago   Palpitations    occasional PVCs and PACs on Holter monitor   PONV (postoperative nausea and vomiting)     Past Surgical History:  Procedure Laterality Date   BREAST SURGERY     right benign breast lump removed    COLONOSCOPY     COLOSTOMY  02/07/2012   Procedure: COLOSTOMY;  Surgeon: Atilano Ina, MD,FACS;  Location: [facility];  Service: General;  Laterality: Left;   COLOSTOMY TAKEDOWN  09/01/2012   Procedure: LAPAROSCOPIC COLOSTOMY TAKEDOWN;  Surgeon: Atilano Ina, MD,FACS;  Location: [facility];  Service: General;  Laterality: N/A;  Lap Assisted Colostomy Reversal    FLEXIBLE SIGMOIDOSCOPY  09/01/2012   Procedure: FLEXIBLE SIGMOIDOSCOPY;  Surgeon: Atilano Ina, MD,FACS;  Location: [facility];  Service: General;;   HYSTEROSCOPY WITH D & C N/A 07/17/2014   Procedure: DILATATION AND CURETTAGE Melton Krebs;  Surgeon: Serita Kyle, MD;  Location: [facility];  Service: Gynecology;  Laterality: N/A;   LAPAROSCOPIC SIGMOID COLECTOMY  09/01/2012   Procedure: LAPAROSCOPIC SIGMOID COLECTOMY;  Surgeon: Atilano Ina, MD,FACS;  Location: [facility];  Service: General;;    LAPAROTOMY  02/07/2012   Procedure: EXPLORATORY LAPAROTOMY;  Surgeon: Atilano Ina, MD,FACS;  Location: [facility];  Service: General;  Laterality: Bilateral;   NOSE SURGERY     PARTIAL COLECTOMY  02/07/2012   Procedure: PARTIAL COLECTOMY;  Surgeon: Atilano Ina, MD,FACS;  Location: [facility];  Service: General;  Laterality: N/A;   POLYPECTOMY     ROTATOR CUFF REPAIR     SIGMOIDOSCOPY     TUBAL LIGATION      Allergies  Allergen Reactions   Blueberry Flavor [Flavoring Agent] Hives   Contrast Media [Iodinated Contrast Media] Hives and Shortness Of Breath    Skin flushed and felt burned   Other Other (See Comments)    trinalin - for sinus infection - keeps me awake    Physical Exam: There were no vitals filed for this visit.  On exam there are palpable pedal pulses bilateral.  Minimal lower extremity edema is noted.  Manual muscle testing is 5/5 bilateral.  There is a unequal limb length of approximately 1 cm of the lower extremity.  First MPJ range of motion slightly decreased.  No crepitus with ankle range of motion.  Some tightness to the plantar fascia central band on palpation bilateral.  Negative Tinel's sign of the posterior tibial nerve bilateral  Assessment/Plan of Care: 1. Plantar  fascial fibromatosis   2. Acquired unequal limb length     FOR HOME USE ONLY DME CUSTOM ORTHOTICS  Discussed clinical findings with patient today.  Agree that she is a good candidate for custom orthotics.  We discussed adding a lift to the orthotic on the shorter leg.  However, the patient called her chiropractor to see if he would like to have this addressed in the orthotic but he noted that he does not.  This was relayed to our office by the patient after her appointment.  Will recommend a full-length orthotic with a soft or neoprene top-cover for shock absorption.  No heel lift will be added to either orthotic  Follow-up for orthotic pickup   Oneill Bais D. Marjoria Mancillas, DPM, FACFAS [facility]      [address] [place], [place] [address]                Office [phone]  Fax [phone]

## 2023-05-18 ENCOUNTER — Telehealth: Payer: Self-pay

## 2023-05-18 NOTE — Telephone Encounter (Signed)
Patient called back yesterday as instructed - DO NOT add lift in orthotics. Please call her back with any questions [phone]  Thanks

## 2023-05-20 ENCOUNTER — Other Ambulatory Visit: Payer: Medicare PPO

## 2023-05-20 LAB — CBC WITH DIFFERENTIAL/PLATELET
Absolute Monocytes: 462 cells/uL (ref 200–950)
Basophils Absolute: 23 cells/uL (ref 0–200)
Eosinophils Absolute: 40 cells/uL (ref 15–500)
Eosinophils Relative: 0.7 %
HCT: 49.7 % — ABNORMAL HIGH (ref 35.0–45.0)
Lymphs Abs: 2685 cells/uL (ref 850–3900)
MCHC: 32.8 g/dL (ref 32.0–36.0)
MPV: 10 fL (ref 7.5–12.5)
Monocytes Relative: 8.1 %
RDW: 12.8 % (ref 11.0–15.0)
Total Lymphocyte: 47.1 %
WBC: 5.7 10*3/uL (ref 3.8–10.8)

## 2023-05-21 LAB — LIPID PANEL
Cholesterol: 188 mg/dL (ref <200)
HDL: 65 mg/dL (ref >=50)
LDL Cholesterol (Calc): 102 mg/dL (calc) — ABNORMAL HIGH
Non-HDL Cholesterol (Calc): 123 mg/dL (calc) (ref <130)
Total CHOL/HDL Ratio: 2.9 (calc) (ref <5.0)
Triglycerides: 113 mg/dL (ref <150)

## 2023-05-21 LAB — COMPLETE METABOLIC PANEL WITH GFR
AG Ratio: 1.8 (calc) (ref 1.0–2.5)
ALT: 24 U/L (ref 6–29)
AST: 20 U/L (ref 10–35)
Albumin: 4.4 g/dL (ref 3.6–5.1)
Alkaline phosphatase (APISO): 74 U/L (ref 37–153)
BUN: 20 mg/dL (ref 7–25)
CO2: 25 mmol/L (ref 20–32)
Calcium: 9.7 mg/dL (ref 8.6–10.4)
Chloride: 102 mmol/L (ref 98–110)
Creat: 0.77 mg/dL (ref 0.50–1.05)
Globulin: 2.5 g/dL (calc) (ref 1.9–3.7)
Glucose, Bld: 107 mg/dL — ABNORMAL HIGH (ref 65–99)
Potassium: 4.9 mmol/L (ref 3.5–5.3)
Sodium: 138 mmol/L (ref 135–146)
Total Bilirubin: 0.8 mg/dL (ref 0.2–1.2)
Total Protein: 6.9 g/dL (ref 6.1–8.1)
eGFR: 83 mL/min/{1.73_m2} (ref >=60)

## 2023-05-21 LAB — CBC WITH DIFFERENTIAL/PLATELET
Basophils Relative: 0.4 %
Hemoglobin: 16.3 g/dL — ABNORMAL HIGH (ref 11.7–15.5)
MCH: 30.4 pg (ref 27.0–33.0)
MCV: 92.7 fL (ref 80.0–100.0)
Neutro Abs: 2491 cells/uL (ref 1500–7800)
Neutrophils Relative %: 43.7 %
Platelets: 295 10*3/uL (ref 140–400)
RBC: 5.36 10*6/uL — ABNORMAL HIGH (ref 3.80–5.10)

## 2023-05-21 LAB — TSH: TSH: 3.12 mIU/L (ref 0.40–4.50)

## 2023-05-21 LAB — HEPATITIS C ANTIBODY: Hepatitis C Ab: NONREACTIVE

## 2023-05-24 ENCOUNTER — Ambulatory Visit
Admission: RE | Admit: 2023-05-24 | Discharge: 2023-05-24 | Disposition: A | Discharge status: Home / Self Care | Payer: Medicare PPO | Source: Ambulatory Visit | Attending: Family Medicine | Admitting: Family Medicine

## 2023-05-24 DIAGNOSIS — K824 Cholesterolosis of gallbladder: Secondary | ICD-10-CM

## 2023-06-02 LAB — HM MAMMOGRAPHY

## 2023-06-03 ENCOUNTER — Telehealth: Payer: Self-pay | Admitting: Podiatry

## 2023-06-03 NOTE — Telephone Encounter (Signed)
Lmom for pt to call back to schedule picking up her orthotics     Balance pending insurance

## 2023-06-07 ENCOUNTER — Ambulatory Visit: Payer: Medicare PPO

## 2023-06-07 DIAGNOSIS — M722 Plantar fascial fibromatosis: Secondary | ICD-10-CM

## 2023-06-07 DIAGNOSIS — M2170 Unequal limb length (acquired), unspecified site: Secondary | ICD-10-CM

## 2023-06-07 NOTE — Progress Notes (Signed)
Ms. Swanigan was in for fitting of new CFO's she did not like the feel of them at all I sent back to [facility] to have high medial flanges added BIL, 2 degree varus posting added and neoprene top covers  Items sent back today. Patient will return for fitting when items are received back  Addison Bailey Cped, CFo, CFm

## 2023-06-24 ENCOUNTER — Telehealth: Payer: Self-pay | Admitting: Podiatry

## 2023-06-24 NOTE — Telephone Encounter (Signed)
Lmom for pt to call back to schedule picking up orthotics ,    Balance is 490.00

## 2023-07-08 ENCOUNTER — Encounter: Payer: Self-pay | Admitting: Family Medicine

## 2023-07-22 ENCOUNTER — Ambulatory Visit: Payer: Medicare PPO

## 2023-07-22 NOTE — Progress Notes (Signed)
Patient presents today to pick up custom molded foot orthotics, diagnosed with Plantar Fasciitis by Dr. Carlota Raspberry.   Orthotics were dispensed and fit was satisfactory. Reviewed instructions for break-in and wear. Written instructions given to patient.  Patient will follow up as needed.   Addison Bailey Cped, CFo, CFm

## 2023-08-04 ENCOUNTER — Ambulatory Visit: Payer: Medicare PPO

## 2023-08-04 VITALS — BP 138/72 | Ht 61.0 in | Wt 178.0 lb

## 2023-08-04 DIAGNOSIS — Z0000 Encounter for general adult medical examination without abnormal findings: Secondary | ICD-10-CM | POA: Diagnosis not present

## 2023-08-04 NOTE — Patient Instructions (Signed)
Ms. Sanks , Thank you for taking time to come for your Medicare Wellness Visit. I appreciate your ongoing commitment to your health goals. Please review the following plan we discussed and let me know if I can assist you in the future.   Referrals/Orders/Follow-Ups/Clinician Recommendations: Aim for 30 minutes of exercise or brisk walking, 6-8 glasses of water, and 5 servings of fruits and vegetables each day.  This is a list of the screening recommended for you and due dates:  Health Maintenance  Topic Date Due   DTaP/Tdap/Td vaccine (1 - Tdap) Never done   Zoster (Shingles) Vaccine (1 of 2) Never done   Pneumonia Vaccine (2 of 2 - PCV) 11/13/2018   Medicare Annual Wellness Visit  08/21/2020   Flu Shot  06/02/2023   COVID-19 Vaccine (1 - 2023-24 season) Never done   Colon Cancer Screening  12/26/2023   Mammogram  06/01/2025   DEXA scan (bone density measurement)  Completed   Hepatitis C Screening  Completed   HPV Vaccine  Aged Out    Advanced directives: (ACP Link)Information on Advanced Care Planning can be found at [facility] [place] Advance Health Care Directives Advance Health Care Directives ([email or web address]   Next Medicare Annual Wellness Visit scheduled for next year: Yes

## 2023-08-04 NOTE — Progress Notes (Signed)
Subjective:   [name] is a 67 y.o. female who presents for Medicare Annual (Subsequent) preventive examination.  Visit Complete: In person  Cardiac Risk Factors include: advanced age (>60men, >28 women)     Objective:    Today's Vitals   08/04/23 1504  BP: 138/72  Weight: 178 lb (80.7 kg)  Height: 5\' 1"  (1.549 m)   Body mass index is 33.63 kg/m.     08/04/2023    3:39 PM 08/22/2019    3:14 PM 06/26/2017    6:00 AM 06/25/2017   10:38 PM 07/15/2014   12:21 PM 09/01/2012    2:00 PM 08/28/2012   12:58 PM  Advanced Directives  Does Patient Have a Medical Advance Directive? No No No No No Patient does not have advance directive Patient does not have advance directive  Would patient like information on creating a medical advance directive? Yes (MAU/Ambulatory/Procedural Areas - Information given) No - Patient declined No - Patient declined  No - patient declined information    Pre-existing out of facility DNR order (yellow form or pink MOST form)      No No    Current Medications (verified) Outpatient Encounter Medications as of 08/04/2023  Medication Sig   Ascorbic Acid (VITAMIN C) 1000 MG tablet Take 1,000 mg by mouth 2 (two) times daily.   ASHWAGANDHA PO Take 3,000 mg by mouth.   B Complex Vitamins (B COMPLEX PO) Take by mouth.   Biotin 5000 MCG TABS Take by mouth.   Calcium Carbonate+Vitamin D 600-200 MG-UNIT TABS Take by mouth.   Cholecalciferol (VITAMIN D3) 125 MCG (5000 UT) CAPS Take 1,000 Units by mouth.   COLLAGEN PO Take by mouth. Grassfed and Pasture raised healthy Gut and immune and Joints 11 grams daily   EPINEPHrine (EPIPEN 2-PAK) 0.3 mg/0.3 mL IJ SOAJ injection Inject 0.3 mg into the muscle as needed for anaphylaxis. INJECT INTO THE MUSCLE ONCE AS NEEDED FOR ALLERGIC REACTION THEN PROCEED TO THE ED   MAGNESIUM PO Take 240 mg by mouth daily.   MISC NATURAL PRODUCTS PO Take by mouth. Liver Cleanse   Multiple Vitamin (MULTIVITAMIN) tablet Take 1 tablet by  mouth daily.   Omega-3 Fatty Acids (FISH OIL) 1000 MG CAPS Take by mouth.   Probiotic Product (PROBIOTIC DAILY PO) Take 1 capsule by mouth daily. Prescript-assist --- broad spectrum probiotic & prebiotic combination   QUERCETIN PO Take by mouth.   Turmeric (QC TUMERIC COMPLEX) 500 MG CAPS Take 2,250 mg by mouth.   Zinc 15 MG CAPS Take by mouth.   [DISCONTINUED] scopolamine (TRANSDERM-SCOP) 1 MG/3DAYS Place 1 patch (1.5 mg total) onto the skin every 3 (three) days.   No facility-administered encounter medications on file as of 08/04/2023.    Allergies (verified) Blueberry flavor [flavoring agent], Contrast media [iodinated contrast media], and Other   History: Past Medical History:  Diagnosis Date   Abnormal EKG    Allergy    various environmental    Diverticulitis of large intestine with perforation 02/07/2012   GERD (gastroesophageal reflux disease)    no meds- past hx    Heart murmur    as a child    Hyperlipidemia    has been borderline    Hypothyroidism    hx of  15 yrs ago   Palpitations    occasional PVCs and PACs on Holter monitor   PONV (postoperative nausea and vomiting)    Past Surgical History:  Procedure Laterality Date   BREAST SURGERY  right benign breast lump removed    COLONOSCOPY     COLOSTOMY  02/07/2012   Procedure: COLOSTOMY;  Surgeon: Atilano Ina, MD,FACS;  Location: [facility] OR;  Service: General;  Laterality: Left;   COLOSTOMY TAKEDOWN  09/01/2012   Procedure: LAPAROSCOPIC COLOSTOMY TAKEDOWN;  Surgeon: Atilano Ina, MD,FACS;  Location: [facility];  Service: General;  Laterality: N/A;  Lap Assisted Colostomy Reversal    FLEXIBLE SIGMOIDOSCOPY  09/01/2012   Procedure: FLEXIBLE SIGMOIDOSCOPY;  Surgeon: Atilano Ina, MD,FACS;  Location: [facility];  Service: General;;   HYSTEROSCOPY WITH D & C N/A 07/17/2014   Procedure: DILATATION AND CURETTAGE Melton Krebs;  Surgeon: Serita Kyle, MD;  Location: [facility];  Service: Gynecology;  Laterality: N/A;    LAPAROSCOPIC SIGMOID COLECTOMY  09/01/2012   Procedure: LAPAROSCOPIC SIGMOID COLECTOMY;  Surgeon: Atilano Ina, MD,FACS;  Location: [facility];  Service: General;;   LAPAROTOMY  02/07/2012   Procedure: EXPLORATORY LAPAROTOMY;  Surgeon: Atilano Ina, MD,FACS;  Location: [facility];  Service: General;  Laterality: Bilateral;   NOSE SURGERY     PARTIAL COLECTOMY  02/07/2012   Procedure: PARTIAL COLECTOMY;  Surgeon: Atilano Ina, MD,FACS;  Location: [facility];  Service: General;  Laterality: N/A;   POLYPECTOMY     ROTATOR CUFF REPAIR     SIGMOIDOSCOPY     TUBAL LIGATION     Family History  Problem Relation Age of Onset   Diverticulosis Mother    Crohn's disease Mother    Diabetes Father    Hypertension Father    Colon cancer Neg Hx    Esophageal cancer Neg Hx    Stomach cancer Neg Hx    Rectal cancer Neg Hx    Colon polyps Neg Hx    Social History   Socioeconomic History   Marital status: Married    Spouse name: Not on file   Number of children: Not on file   Years of education: Not on file   Highest education level: Not on file  Occupational History   Not on file  Tobacco Use   Smoking status: Never   Smokeless tobacco: Never  Vaping Use   Vaping status: Never Used  Substance and Sexual Activity   Alcohol use: No   Drug use: No   Sexual activity: Not on file  Other Topics Concern   Not on file  Social History Narrative   Not on file   Social Determinants of Health   Financial Resource Strain: Low Risk  (08/04/2023)   Overall Financial Resource Strain (CARDIA)    Difficulty of Paying Living Expenses: Not hard at all  Food Insecurity: No Food Insecurity (08/04/2023)   Hunger Vital Sign    Worried About Running Out of Food in the Last Year: Never true    Ran Out of Food in the Last Year: Never true  Transportation Needs: No Transportation Needs (08/04/2023)   PRAPARE - Administrator, Civil Service (Medical): No    Lack of Transportation (Non-Medical): No  Physical  Activity: Insufficiently Active (08/04/2023)   Exercise Vital Sign    Days of Exercise per Week: 3 days    Minutes of Exercise per Session: 30 min  Stress: No Stress Concern Present (08/04/2023)   [employer] of Occupational Health - Occupational Stress Questionnaire    Feeling of Stress : Not at all  Social Connections: Socially Integrated (08/04/2023)   Social Connection and Isolation Panel [NHANES]    Frequency of Communication with Friends  and Family: More than three times a week    Frequency of Social Gatherings with Friends and Family: Three times a week    Attends Religious Services: More than 4 times per year    Active Member of Clubs or Organizations: Yes    Attends Engineer, structural: More than 4 times per year    Marital Status: Married    Tobacco Counseling Counseling given: Not Answered   Clinical Intake:  Pre-visit preparation completed: Yes  Pain : No/denies pain     Diabetes: No  How often do you need to have someone help you when you read instructions, pamphlets, or other written materials from your doctor or pharmacy?: 1 - Never  Interpreter Needed?: No  Information entered by :: Kandis Fantasia LPN   Activities of Daily Living    08/04/2023    3:39 PM  In your present state of health, do you have any difficulty performing the following activities:  Hearing? 0  Vision? 0  Difficulty concentrating or making decisions? 0  Walking or climbing stairs? 0  Dressing or bathing? 0  Doing errands, shopping? 0  Preparing Food and eating ? N  Using the Toilet? N  In the past six months, have you accidently leaked urine? N  Do you have problems with loss of bowel control? N  Managing your Medications? N  Managing your Finances? N  Housekeeping or managing your Housekeeping? N    Patient Care Team: Park Meo, FNP as PCP - General (Family Medicine) Obgyn, Ma Hillock  Indicate any recent Medical Services you may have received from other  than [facility] providers in the past year (date may be approximate).     Assessment:   This is a routine wellness examination for [place].  Hearing/Vision screen Hearing Screening - Comments:: Denies hearing difficulties   Vision Screening - Comments:: No vision problems; will schedule routine eye exam soon     Goals Addressed             This Visit's Progress    Remain active and independent        Depression Screen    08/04/2023    3:38 PM 08/22/2019    3:12 PM 02/15/2017    9:31 AM  PHQ 2/9 Scores  PHQ - 2 Score 0 0 0  PHQ- 9 Score   3    Fall Risk    08/04/2023    3:39 PM 08/22/2019    3:12 PM 02/15/2017    9:31 AM  Fall Risk   Falls in the past year? 0 0 No  Number falls in past yr: 0    Injury with Fall? 0    Risk for fall due to : No Fall Risks    Follow up Falls prevention discussed;Education provided;Falls evaluation completed Falls evaluation completed     MEDICARE RISK AT HOME: Medicare Risk at Home Any stairs in or around the home?: No If so, are there any without handrails?: No Home free of loose throw rugs in walkways, pet beds, electrical cords, etc?: Yes Adequate lighting in your home to reduce risk of falls?: Yes Life alert?: No Use of a cane, walker or w/c?: No Grab bars in the bathroom?: Yes Shower chair or bench in shower?: No Elevated toilet seat or a handicapped toilet?: Yes  TIMED UP AND GO:  Was the test performed?  Yes  Length of time to ambulate 10 feet: 8 sec Gait steady and fast without use of assistive device  Cognitive Function:        08/04/2023    3:39 PM  6CIT Screen  What Year? 0 points  What month? 0 points  What time? 0 points  Count back from 20 0 points  Months in reverse 0 points  Repeat phrase 0 points  Total Score 0 points    Immunizations Immunization History  Administered Date(s) Administered   Influenza Split 09/05/2012   Influenza,inj,Quad PF,6+ Mos 07/23/2014, 07/17/2015   Pneumococcal  Polysaccharide-23 09/05/2012    TDAP status: Due, Education has been provided regarding the importance of this vaccine. Advised may receive this vaccine at local pharmacy or Health Dept. Aware to provide a copy of the vaccination record if obtained from local pharmacy or Health Dept. Verbalized acceptance and understanding.  Flu Vaccine status: Declined, Education has been provided regarding the importance of this vaccine but patient still declined. Advised may receive this vaccine at local pharmacy or Health Dept. Aware to provide a copy of the vaccination record if obtained from local pharmacy or Health Dept. Verbalized acceptance and understanding.  Pneumococcal vaccine status: Declined,  Education has been provided regarding the importance of this vaccine but patient still declined. Advised may receive this vaccine at local pharmacy or Health Dept. Aware to provide a copy of the vaccination record if obtained from local pharmacy or Health Dept. Verbalized acceptance and understanding.   Covid-19 vaccine status: Declined, Education has been provided regarding the importance of this vaccine but patient still declined. Advised may receive this vaccine at local pharmacy or Health Dept.or vaccine clinic. Aware to provide a copy of the vaccination record if obtained from local pharmacy or Health Dept. Verbalized acceptance and understanding.  Qualifies for Shingles Vaccine? Yes   Zostavax completed No   Shingrix Completed?: No.    Education has been provided regarding the importance of this vaccine. Patient has been advised to call insurance company to determine out of pocket expense if they have not yet received this vaccine. Advised may also receive vaccine at local pharmacy or Health Dept. Verbalized acceptance and understanding.  Screening Tests Health Maintenance  Topic Date Due   DTaP/Tdap/Td (1 - Tdap) Never done   Zoster Vaccines- Shingrix (1 of 2) Never done   Pneumonia Vaccine 65+ Years  old (2 of 2 - PCV) 11/13/2018   INFLUENZA VACCINE  06/02/2023   COVID-19 Vaccine (1 - 2023-24 season) Never done   Colonoscopy  12/26/2023   Medicare Annual Wellness (AWV)  08/03/2024   MAMMOGRAM  06/01/2025   DEXA SCAN  Completed   Hepatitis C Screening  Completed   HPV VACCINES  Aged Out    Health Maintenance  Health Maintenance Due  Topic Date Due   DTaP/Tdap/Td (1 - Tdap) Never done   Zoster Vaccines- Shingrix (1 of 2) Never done   Pneumonia Vaccine 54+ Years old (2 of 2 - PCV) 11/13/2018   INFLUENZA VACCINE  06/02/2023   COVID-19 Vaccine (1 - 2023-24 season) Never done    Colorectal cancer screening: Type of screening: Colonoscopy. Completed 12/25/20. Repeat every 3 years  Mammogram status: Completed 06/02/23. Repeat every year  Bone Density status: Completed 02/14/23. Results reflect: Bone density results: OSTEOPENIA. Repeat every 2-3 years. (Managed by OB/GYN)  Lung Cancer Screening: (Low Dose CT Chest recommended if Age 42-80 years, 20 pack-year currently smoking OR have quit w/in 15years.) does not qualify.   Lung Cancer Screening Referral: n/a  Additional Screening:  Hepatitis C Screening: does qualify; Completed 05/20/23  Vision Screening: Recommended  annual ophthalmology exams for early detection of glaucoma and other disorders of the eye. Is the patient up to date with their annual eye exam?  No  Who is the provider or what is the name of the office in which the patient attends annual eye exams? None  If pt is not established with a provider, would they like to be referred to a provider to establish care? No .   Dental Screening: Recommended annual dental exams for proper oral hygiene  Community Resource Referral / Chronic Care Management: CRR required this visit?  No   CCM required this visit?  No     Plan:     I have personally reviewed and noted the following in the patient's chart:   Medical and social history Use of alcohol, tobacco or illicit  drugs  Current medications and supplements including opioid prescriptions. Patient is not currently taking opioid prescriptions. Functional ability and status Nutritional status Physical activity Advanced directives List of other physicians Hospitalizations, surgeries, and ER visits in previous 12 months Vitals Screenings to include cognitive, depression, and falls Referrals and appointments  In addition, I have reviewed and discussed with patient certain preventive protocols, quality metrics, and best practice recommendations. A written personalized care plan for preventive services as well as general preventive health recommendations were provided to patient.     Kandis Fantasia [place], [place]   24/01/101   After Visit Summary: (In Person-Printed) AVS printed and given to the patient  Nurse Notes: No concerns at this time

## 2024-04-25 ENCOUNTER — Encounter: Payer: Self-pay | Admitting: Internal Medicine

## 2024-06-20 ENCOUNTER — Ambulatory Visit (AMBULATORY_SURGERY_CENTER): Admitting: *Deleted

## 2024-06-20 VITALS — Ht 60.0 in | Wt 175.0 lb

## 2024-06-20 DIAGNOSIS — Z860100 Personal history of colon polyps, unspecified: Secondary | ICD-10-CM

## 2024-06-20 MED ORDER — NA SULFATE-K SULFATE-MG SULF 17.5-3.13-1.6 GM/177ML PO SOLN: 1.0000 | Freq: Once | ORAL | 0 refills | Status: AC

## 2024-06-20 NOTE — Progress Notes (Signed)
  Pre visit completed over telephone. Instructions through MyChart and secure email  No egg or soy allergy known to patient  No issues known to pt with past sedation with any surgeries or procedures Patient denies ever being told they had issues or difficulty with intubation  No FH of Malignant Hyperthermia Pt is not on diet pills Pt is not on  home 02  Pt is not on blood thinners  Pt denies issues with constipation  No A fib or A flutter Have any cardiac testing pending-- NO Pt instructed to use [email or web address].com or GoodRx for a price reduction on prep    Chart reviewed by DOROTHA Schillings, CRNA

## 2024-06-21 ENCOUNTER — Encounter: Payer: Self-pay | Admitting: Internal Medicine

## 2024-07-04 ENCOUNTER — Ambulatory Visit: Admitting: Internal Medicine

## 2024-07-04 ENCOUNTER — Telehealth: Payer: Self-pay | Admitting: *Deleted

## 2024-07-04 ENCOUNTER — Encounter: Payer: Self-pay | Admitting: Internal Medicine

## 2024-07-04 VITALS — BP 154/63 | HR 60 | Temp 97.6°F | Resp 16 | Ht 61.0 in | Wt 175.0 lb

## 2024-07-04 DIAGNOSIS — D123 Benign neoplasm of transverse colon: Secondary | ICD-10-CM | POA: Diagnosis not present

## 2024-07-04 DIAGNOSIS — K635 Polyp of colon: Secondary | ICD-10-CM

## 2024-07-04 DIAGNOSIS — D120 Benign neoplasm of cecum: Secondary | ICD-10-CM

## 2024-07-04 DIAGNOSIS — Z1211 Encounter for screening for malignant neoplasm of colon: Secondary | ICD-10-CM | POA: Diagnosis not present

## 2024-07-04 DIAGNOSIS — K5730 Diverticulosis of large intestine without perforation or abscess without bleeding: Secondary | ICD-10-CM

## 2024-07-04 DIAGNOSIS — Z980 Intestinal bypass and anastomosis status: Secondary | ICD-10-CM

## 2024-07-04 DIAGNOSIS — D122 Benign neoplasm of ascending colon: Secondary | ICD-10-CM

## 2024-07-04 DIAGNOSIS — Z860101 Personal history of adenomatous and serrated colon polyps: Secondary | ICD-10-CM

## 2024-07-04 DIAGNOSIS — Z860100 Personal history of colon polyps, unspecified: Secondary | ICD-10-CM

## 2024-07-04 MED ORDER — SODIUM CHLORIDE 0.9 % IV SOLN: 500.0000 mL | INTRAVENOUS | Status: DC

## 2024-07-04 NOTE — Patient Instructions (Addendum)
-  3 polyps removed and sent to pathology - Diverticulosis - Resume previous diet. - Continue present medications. - Await pathology results. - Repeat colonoscopy is recommended for    surveillance. The colonoscopy date will be    determined after pathology results from today's    exam become available for review.  YOU HAD AN ENDOSCOPIC PROCEDURE TODAY AT THE [facility]:   Refer to the procedure report that was given to you for any specific questions about what was found during the examination.  If the procedure report does not answer your questions, please call your gastroenterologist to clarify.  If you requested that your care partner not be given the details of your procedure findings, then the procedure report has been included in a sealed envelope for you to review at your convenience later.  YOU SHOULD EXPECT: Some feelings of bloating in the abdomen. Passage of more gas than usual.  Walking can help get rid of the air that was put into your GI tract during the procedure and reduce the bloating. If you had a lower endoscopy (such as a colonoscopy or flexible sigmoidoscopy) you may notice spotting of blood in your stool or on the toilet paper. If you underwent a bowel prep for your procedure, you may not have a normal bowel movement for a few days.  Please Note:  You might notice some irritation and congestion in your nose or some drainage.  This is from the oxygen used during your procedure.  There is no need for concern and it should clear up in a day or so.  SYMPTOMS TO REPORT IMMEDIATELY:  Following lower endoscopy (colonoscopy or flexible sigmoidoscopy):  Excessive amounts of blood in the stool  Significant tenderness or worsening of abdominal pains  Swelling of the abdomen that is new, acute  Fever of 100F or higher   For urgent or emergent issues, a gastroenterologist can be reached at any hour by calling [phone]. Do not use MyChart messaging for urgent  concerns.    DIET:  We do recommend a small meal at first, but then you may proceed to your regular diet.  Drink plenty of fluids but you should avoid alcoholic beverages for 24 hours.  ACTIVITY:  You should plan to take it easy for the rest of today and you should NOT DRIVE or use heavy machinery until tomorrow (because of the sedation medicines used during the test).    FOLLOW UP: Our staff will call the number listed on your records the next business day following your procedure.  We will call around 7:15- 8:00 am to check on you and address any questions or concerns that you may have regarding the information given to you following your procedure. If we do not reach you, we will leave a message.     If any biopsies were taken you will be contacted by phone or by letter within the next 1-3 weeks.  Please call us  at [phone] if you have not heard about the biopsies in 3 weeks.    SIGNATURES/CONFIDENTIALITY: You and/or your care partner have signed paperwork which will be entered into your electronic medical record.  These signatures attest to the fact that that the information above on your After Visit Summary has been reviewed and is understood.  Full responsibility of the confidentiality of this discharge information lies with you and/or your care-partner.

## 2024-07-04 NOTE — Telephone Encounter (Signed)
 Pt called reporting dizziness and headache after bowel prep. States dizziness is mild and she is able to get around, just having to be more careful. Reports headache has been ongoing since she woke up this morning and it is moderate discomfort. She states she read on the prep box to contact her provider is she developed these symptoms. RN reassured pt that she is likely having mild side effects from fluid loss caused by the prep. Pt states she is trying to drink plenty of fluids: drank about 1 gallon yesterday and 1/2 gallon today. RN encouraged pt to rest for now and to call back if she starts to feel worse. Pt verbalized understanding.

## 2024-07-04 NOTE — Op Note (Signed)
 [facility] Patient Name: [name] Procedure Date: 07/04/2024 1:01 PM MRN: [id number] Endoscopist: Gordy CHRISTELLA Starch , MD, [id number] Age: 68 Referring MD:  Date of Birth: 05/28/1956 Gender: Female Account #: [id number] Procedure:                Colonoscopy Indications:              High risk colon cancer surveillance: Personal                            history of multiple adenomas, Last colonoscopy:                            February 2022 (SSP x 1), Dec 2020 (TA and SSP x 11) Medicines:                Monitored Anesthesia Care Procedure:                Pre-Anesthesia Assessment:                           - Prior to the procedure, a History and Physical                            was performed, and patient medications and                            allergies were reviewed. The patient's tolerance of                            previous anesthesia was also reviewed. The risks                            and benefits of the procedure and the sedation                            options and risks were discussed with the patient.                            All questions were answered, and informed consent                            was obtained. Prior Anticoagulants: The patient has                            taken no anticoagulant or antiplatelet agents. ASA                            Grade Assessment: II - A patient with mild systemic                            disease. After reviewing the risks and benefits,                            the patient was deemed in satisfactory condition to  undergo the procedure.                           After obtaining informed consent, the colonoscope                            was passed under direct vision. Throughout the                            procedure, the patient's blood pressure, pulse, and                            oxygen saturations were monitored continuously. The                            [id number]-HQ190L  Colonoscope [id number] was introduced                            through the anus and advanced to the cecum,                            identified by appendiceal orifice and ileocecal                            valve. The colonoscopy was performed without                            difficulty. The patient tolerated the procedure                            well. The quality of the bowel preparation was                            good. The ileocecal valve, appendiceal orifice, and                            rectum were photographed. Scope In: 1:26:01 PM Scope Out: 1:39:29 PM Scope Withdrawal Time: 0 hours 10 minutes 59 seconds  Total Procedure Duration: 0 hours 13 minutes 28 seconds  Findings:                 The digital rectal exam was normal.                           A 2 mm polyp was found in the cecum. The polyp was                            sessile. The polyp was removed with a cold snare.                            Resection and retrieval were complete.                           A 3 mm polyp was found in the ascending colon. The  polyp was sessile. The polyp was removed with a                            cold snare. Resection and retrieval were complete.                           A 4 mm polyp was found in the transverse colon. The                            polyp was sessile. The polyp was removed with a                            cold snare. Resection and retrieval were complete.                           There was evidence of a prior end-to-end                            colo-colonic anastomosis in the sigmoid colon. This                            was patent and was characterized by healthy                            appearing mucosa.                           Multiple medium-mouthed and small-mouthed                            diverticula were found in the sigmoid colon,                            descending colon, transverse colon, ascending colon                             and cecum.                           The retroflexed view of the distal rectum and anal                            verge was normal and showed no anal or rectal                            abnormalities. Complications:            No immediate complications. Estimated Blood Loss:     Estimated blood loss was minimal. Impression:               - One 2 mm polyp in the cecum, removed with a cold                            snare. Resected and retrieved.                           -  One 3 mm polyp in the ascending colon, removed                            with a cold snare. Resected and retrieved.                           - One 4 mm polyp in the transverse colon, removed                            with a cold snare. Resected and retrieved.                           - Patent end-to-end colo-colonic anastomosis,                            characterized by healthy appearing mucosa.                           - Moderate diverticulosis in the sigmoid colon, in                            the descending colon, in the transverse colon, in                            the ascending colon and in the cecum.                           - The distal rectum and anal verge are normal on                            retroflexion view. Recommendation:           - Patient has a contact number available for                            emergencies. The signs and symptoms of potential                            delayed complications were discussed with the                            patient. Return to normal activities tomorrow.                            Written discharge instructions were provided to the                            patient.                           - Resume previous diet.                           - Continue present medications.                           -  Await pathology results.                           - Repeat colonoscopy is recommended for                            surveillance.  The colonoscopy date will be                            determined after pathology results from today's                            exam become available for review. Gordy CHRISTELLA Starch, MD 07/04/2024 1:43:50 PM This report has been signed electronically.

## 2024-07-04 NOTE — Progress Notes (Signed)
 GASTROENTEROLOGY PROCEDURE H&P NOTE   Primary Care Physician: Kayla Jeoffrey RAMAN, FNP    Reason for Procedure:   Hx of colon polyps  Plan:    colonoscopy  Patient is appropriate for endoscopic procedure(s) in the ambulatory ([facility]) setting.  The nature of the procedure, as well as the risks, benefits, and alternatives were carefully and thoroughly reviewed with the patient. Ample time for discussion and questions allowed. The patient understood, was satisfied, and agreed to proceed.     HPI: [name] is a 68 y.o. female who presents for colonoscopy.  Medical history as below.  Tolerated the prep.  No recent chest pain or shortness of breath.  No abdominal pain today.  Past Medical History:  Diagnosis Date   Abnormal EKG    Allergy    various environmental    Diverticulitis of large intestine with perforation 02/07/2012   GERD (gastroesophageal reflux disease)    no meds- past hx    Heart murmur    as a child    Hyperlipidemia    has been borderline    Hypothyroidism    hx of  15 yrs ago   Palpitations    occasional PVCs and PACs on Holter monitor   PONV (postoperative nausea and vomiting)     Past Surgical History:  Procedure Laterality Date   BREAST SURGERY     right benign breast lump removed    COLONOSCOPY     COLOSTOMY  02/07/2012   Procedure: COLOSTOMY;  Surgeon: Camellia CHRISTELLA Blush, MD,FACS;  Location: [facility] OR;  Service: General;  Laterality: Left;   COLOSTOMY TAKEDOWN  09/01/2012   Procedure: LAPAROSCOPIC COLOSTOMY TAKEDOWN;  Surgeon: Camellia CHRISTELLA Blush, MD,FACS;  Location: [facility];  Service: General;  Laterality: N/A;  Lap Assisted Colostomy Reversal    FLEXIBLE SIGMOIDOSCOPY  09/01/2012   Procedure: FLEXIBLE SIGMOIDOSCOPY;  Surgeon: Camellia CHRISTELLA Blush, MD,FACS;  Location: [facility];  Service: General;;   HYSTEROSCOPY WITH D & C N/A 07/17/2014   Procedure: DILATATION AND CURETTAGE LELDON;  Surgeon: Dickie DELENA Carder, MD;  Location: [facility];  Service: Gynecology;   Laterality: N/A;   LAPAROSCOPIC SIGMOID COLECTOMY  09/01/2012   Procedure: LAPAROSCOPIC SIGMOID COLECTOMY;  Surgeon: Camellia CHRISTELLA Blush, MD,FACS;  Location: [facility];  Service: General;;   LAPAROTOMY  02/07/2012   Procedure: EXPLORATORY LAPAROTOMY;  Surgeon: Camellia CHRISTELLA Blush, MD,FACS;  Location: [facility];  Service: General;  Laterality: Bilateral;   NOSE SURGERY     PARTIAL COLECTOMY  02/07/2012   Procedure: PARTIAL COLECTOMY;  Surgeon: Camellia CHRISTELLA Blush, MD,FACS;  Location: [facility] OR;  Service: General;  Laterality: N/A;   POLYPECTOMY     ROTATOR CUFF REPAIR     SIGMOIDOSCOPY     TUBAL LIGATION      Prior to Admission medications   Medication Sig Start Date End Date Taking? Authorizing Provider  Ascorbic Acid (VITAMIN C) 1000 MG tablet Take 1,000 mg by mouth 2 (two) times daily.   Yes [provider]  B Complex Vitamins (B COMPLEX PO) Take by mouth.   Yes [provider]  Cholecalciferol (VITAMIN D3) 125 MCG (5000 UT) CAPS Take 4,000 Units by mouth.   Yes [provider]  COLLAGEN PO Take by mouth. Grassfed and Pasture raised healthy Gut and immune and Joints 11 grams daily   Yes [provider]  MAGNESIUM GLYCINATE PO Take 240 mg by mouth daily.   Yes [provider]  MAGNESIUM OXIDE PO Take 1,000 mg by mouth  every other day.   Yes [provider]  ASHWAGANDHA PO Take 3,000 mg by mouth. Patient not taking: Reported on 06/20/2024    [provider]  Biotin 5000 MCG TABS Take by mouth. Patient not taking: Reported on 06/20/2024    [provider]  Calcium Carbonate+Vitamin D 600-200 MG-UNIT TABS Take by mouth. Patient not taking: Reported on 06/20/2024    [provider]  EPINEPHrine  (EPIPEN  2-PAK) 0.3 mg/0.3 mL IJ SOAJ injection Inject 0.3 mg into the muscle as needed for anaphylaxis. INJECT INTO THE MUSCLE ONCE AS NEEDED FOR ALLERGIC REACTION THEN PROCEED TO THE ED 05/16/23   Kayla Jeoffrey RAMAN, FNP  MISC NATURAL PRODUCTS PO Take by  mouth. Liver Cleanse Patient not taking: Reported on 06/20/2024    [provider]  Multiple Vitamin (MULTIVITAMIN) tablet Take 1 tablet by mouth daily. Patient not taking: Reported on 06/20/2024    [provider]  Omega-3 Fatty Acids (FISH OIL) 1000 MG CAPS Take by mouth. Patient not taking: Reported on 06/20/2024    [provider]  Probiotic Product (PROBIOTIC DAILY PO) Take 1 capsule by mouth daily. Prescript-assist --- broad spectrum probiotic & prebiotic combination Patient not taking: Reported on 06/20/2024    [provider]  QUERCETIN PO Take by mouth. Patient not taking: Reported on 06/20/2024    [provider]  SELENIUM PO Take 200 mcg by mouth daily. Patient not taking: Reported on 07/04/2024    [provider]  Turmeric (QC TUMERIC COMPLEX) 500 MG CAPS Take 2,250 mg by mouth. Patient not taking: Reported on 06/20/2024    [provider]  Zinc 15 MG CAPS Take by mouth. Patient not taking: Reported on 06/20/2024    [provider]    Current Outpatient Medications  Medication Sig Dispense Refill   Ascorbic Acid (VITAMIN C) 1000 MG tablet Take 1,000 mg by mouth 2 (two) times daily.     B Complex Vitamins (B COMPLEX PO) Take by mouth.     Cholecalciferol (VITAMIN D3) 125 MCG (5000 UT) CAPS Take 4,000 Units by mouth.     COLLAGEN PO Take by mouth. Grassfed and Pasture raised healthy Gut and immune and Joints 11 grams daily     MAGNESIUM GLYCINATE PO Take 240 mg by mouth daily.     MAGNESIUM OXIDE PO Take 1,000 mg by mouth every other day.     ASHWAGANDHA PO Take 3,000 mg by mouth. (Patient not taking: Reported on 06/20/2024)     Biotin 5000 MCG TABS Take by mouth. (Patient not taking: Reported on 06/20/2024)     Calcium Carbonate+Vitamin D 600-200 MG-UNIT TABS Take by mouth. (Patient not taking: Reported on 06/20/2024)     EPINEPHrine  (EPIPEN  2-PAK) 0.3 mg/0.3 mL IJ SOAJ injection Inject 0.3 mg into the muscle as  needed for anaphylaxis. INJECT INTO THE MUSCLE ONCE AS NEEDED FOR ALLERGIC REACTION THEN PROCEED TO THE ED 2 each 0   MISC NATURAL PRODUCTS PO Take by mouth. Liver Cleanse (Patient not taking: Reported on 06/20/2024)     Multiple Vitamin (MULTIVITAMIN) tablet Take 1 tablet by mouth daily. (Patient not taking: Reported on 06/20/2024)     Omega-3 Fatty Acids (FISH OIL) 1000 MG CAPS Take by mouth. (Patient not taking: Reported on 06/20/2024)     Probiotic Product (PROBIOTIC DAILY PO) Take 1 capsule by mouth daily. Prescript-assist --- broad spectrum probiotic & prebiotic combination (Patient not taking: Reported on 06/20/2024)     QUERCETIN PO Take by mouth. (Patient  not taking: Reported on 06/20/2024)     SELENIUM PO Take 200 mcg by mouth daily. (Patient not taking: Reported on 07/04/2024)     Turmeric (QC TUMERIC COMPLEX) 500 MG CAPS Take 2,250 mg by mouth. (Patient not taking: Reported on 06/20/2024)     Zinc 15 MG CAPS Take by mouth. (Patient not taking: Reported on 06/20/2024)     Current Facility-Administered Medications  Medication Dose Route Frequency Provider Last Rate Last Admin   0.9 %  sodium chloride  infusion  500 mL Intravenous Continuous Felishia Wartman, Gordy HERO, MD        Allergies as of 07/04/2024 - Review Complete 07/04/2024  Allergen Reaction Noted   Blueberry flavor [flavoring agent (non-screening)] Hives 05/16/2023   Contrast media [iodinated contrast media] Hives and Shortness Of Breath 02/06/2014   Other Other (See Comments) 08/21/2012    Family History  Problem Relation Age of Onset   Diverticulosis Mother    Crohn's disease Mother    Diabetes Father    Hypertension Father    Colon cancer Neg Hx    Esophageal cancer Neg Hx    Stomach cancer Neg Hx    Rectal cancer Neg Hx    Colon polyps Neg Hx     Social History   Socioeconomic History   Marital status: Married    Spouse name: Not on file   Number of children: Not on file   Years of education: Not on file   Highest  education level: Not on file  Occupational History   Not on file  Tobacco Use   Smoking status: Never   Smokeless tobacco: Never  Vaping Use   Vaping status: Never Used  Substance and Sexual Activity   Alcohol use: No   Drug use: No   Sexual activity: Not on file  Other Topics Concern   Not on file  Social History Narrative   Not on file   Social Drivers of Health   Financial Resource Strain: Low Risk  (08/04/2023)   Overall Financial Resource Strain (CARDIA)    Difficulty of Paying Living Expenses: Not hard at all  Food Insecurity: No Food Insecurity (08/04/2023)   Hunger Vital Sign    Worried About Running Out of Food in the Last Year: Never true    Ran Out of Food in the Last Year: Never true  Transportation Needs: No Transportation Needs (08/04/2023)   PRAPARE - Administrator, Civil Service (Medical): No    Lack of Transportation (Non-Medical): No  Physical Activity: Insufficiently Active (08/04/2023)   Exercise Vital Sign    Days of Exercise per Week: 3 days    Minutes of Exercise per Session: 30 min  Stress: No Stress Concern Present (08/04/2023)   [employer] of Occupational Health - Occupational Stress Questionnaire    Feeling of Stress : Not at all  Social Connections: Socially Integrated (08/04/2023)   Social Connection and Isolation Panel    Frequency of Communication with Friends and Family: More than three times a week    Frequency of Social Gatherings with Friends and Family: Three times a week    Attends Religious Services: More than 4 times per year    Active Member of Clubs or Organizations: Yes    Attends Banker Meetings: More than 4 times per year    Marital Status: Married  Catering manager Violence: Not At Risk (08/04/2023)   Humiliation, Afraid, Rape, and Kick questionnaire    Fear of Current or Ex-Partner: No  Emotionally Abused: No    Physically Abused: No    Sexually Abused: No    Physical Exam: Vital signs in  last 24 hours: @BP  136/74   Pulse 72   Temp 97.6 F (36.4 C)   Ht 5' 1 (1.549 m)   Wt 175 lb (79.4 kg)   SpO2 97%   BMI 33.07 kg/m  GEN: NAD EYE: Sclerae anicteric ENT: MMM CV: Non-tachycardic Pulm: CTA b/l GI: Soft, NT/ND NEURO:  Alert & Oriented x 3   Gordy Starch, MD [facility]  07/04/2024 1:18 PM

## 2024-07-04 NOTE — Progress Notes (Signed)
 Report to PACU, RN, vss, BBS= Clear.

## 2024-07-04 NOTE — Progress Notes (Signed)
 Called to room to assist during endoscopic procedure.  Patient ID and intended procedure confirmed with present staff. Received instructions for my participation in the procedure from the performing physician.

## 2024-07-04 NOTE — Progress Notes (Signed)
 Pt's states no medical or surgical changes since previsit or office visit.

## 2024-07-05 ENCOUNTER — Telehealth: Payer: Self-pay | Admitting: *Deleted

## 2024-07-05 NOTE — Telephone Encounter (Signed)
  Follow up Call-     07/04/2024   12:48 PM  Call back number  Post procedure Call Back phone  # [phone]  Permission to leave phone message Yes    Left message on machine to call back if any questions or concerns

## 2024-07-09 ENCOUNTER — Ambulatory Visit: Payer: Self-pay | Admitting: Internal Medicine

## 2024-07-09 LAB — SURGICAL PATHOLOGY

## 2024-07-24 ENCOUNTER — Encounter: Payer: Self-pay | Admitting: Internal Medicine

## 2024-08-08 ENCOUNTER — Telehealth: Payer: Self-pay | Admitting: *Deleted

## 2024-08-08 NOTE — Telephone Encounter (Signed)
 Left message to confirm visit.  If patient wanted telephone or video visit please change .

## 2024-08-09 ENCOUNTER — Ambulatory Visit: Payer: Medicare PPO | Admitting: *Deleted

## 2024-08-09 VITALS — Ht 60.0 in | Wt 175.0 lb

## 2024-08-09 DIAGNOSIS — Z0000 Encounter for general adult medical examination without abnormal findings: Secondary | ICD-10-CM | POA: Diagnosis not present

## 2024-08-09 NOTE — Progress Notes (Signed)
 Subjective:   [name] is a 68 y.o. female who presents for Medicare Annual (Subsequent) preventive examination.  Visit Complete: In person  Patient Medicare AWV questionnaire was completed by the patient on 08-02-2024; I have confirmed that all information answered by patient is correct and no changes since this date.  Cardiac Risk Factors include: advanced age (>48men, >36 women);obesity (BMI >30kg/m2)     Objective:    Today's Vitals   08/09/24 1532  Weight: 175 lb (79.4 kg)  Height: 5' (1.524 m)   Body mass index is 34.18 kg/m.     08/09/2024    3:57 PM 08/04/2023    3:39 PM 08/22/2019    3:14 PM 06/26/2017    6:00 AM 06/25/2017   10:38 PM 07/15/2014   12:21 PM 09/01/2012    2:00 PM  Advanced Directives  Does Patient Have a Medical Advance Directive? No No No No  No  No  Patient does not have advance directive   Would patient like information on creating a medical advance directive? No - Patient declined Yes (MAU/Ambulatory/Procedural Areas - Information given) No - Patient declined No - Patient declined   No - patient declined information    Pre-existing out of facility DNR order (yellow form or pink MOST form)       No      Data saved with a previous flowsheet row definition    Current Medications (verified) Outpatient Encounter Medications as of 08/09/2024  Medication Sig   Ascorbic Acid (VITAMIN C) 1000 MG tablet Take 1,000 mg by mouth 2 (two) times daily.   Cholecalciferol (VITAMIN D3) 125 MCG (5000 UT) CAPS Take 4,000 Units by mouth.   COLLAGEN PO Take by mouth. Grassfed and Pasture raised healthy Gut and immune and Joints 11 grams daily   EPINEPHrine  (EPIPEN  2-PAK) 0.3 mg/0.3 mL IJ SOAJ injection Inject 0.3 mg into the muscle as needed for anaphylaxis. INJECT INTO THE MUSCLE ONCE AS NEEDED FOR ALLERGIC REACTION THEN PROCEED TO THE ED   MAGNESIUM GLYCINATE PO Take 240 mg by mouth daily.   MAGNESIUM OXIDE PO Take 1,000 mg by mouth every other day.   MISC  NATURAL PRODUCTS PO Take by mouth. Liver Cleanse   Multiple Vitamin (MULTIVITAMIN) tablet Take 1 tablet by mouth daily.   Omega-3 Fatty Acids (FISH OIL) 1000 MG CAPS Take by mouth.   Probiotic Product (PROBIOTIC DAILY PO) Take 1 capsule by mouth daily. Prescript-assist --- broad spectrum probiotic & prebiotic combination   ASHWAGANDHA PO Take 3,000 mg by mouth. (Patient not taking: Reported on 08/09/2024)   B Complex Vitamins (B COMPLEX PO) Take by mouth.   Biotin 5000 MCG TABS Take by mouth. (Patient not taking: Reported on 08/09/2024)   Calcium Carbonate+Vitamin D 600-200 MG-UNIT TABS Take by mouth. (Patient not taking: Reported on 08/09/2024)   QUERCETIN PO Take by mouth. (Patient not taking: Reported on 06/20/2024)   SELENIUM PO Take 200 mcg by mouth daily. (Patient not taking: Reported on 08/09/2024)   Turmeric (QC TUMERIC COMPLEX) 500 MG CAPS Take 2,250 mg by mouth. (Patient not taking: Reported on 08/09/2024)   Zinc 15 MG CAPS Take by mouth. (Patient not taking: Reported on 08/09/2024)   No facility-administered encounter medications on file as of 08/09/2024.    Allergies (verified) Blueberry flavor [flavoring agent (non-screening)], Contrast media [iodinated contrast media], and Other   History: Past Medical History:  Diagnosis Date   Abnormal EKG    Allergy    various environmental  Diverticulitis of large intestine with perforation 02/07/2012   GERD (gastroesophageal reflux disease)    no meds- past hx    Heart murmur    as a child    Hyperlipidemia    has been borderline    Hypothyroidism    hx of  15 yrs ago   Palpitations    occasional PVCs and PACs on Holter monitor   PONV (postoperative nausea and vomiting)    Past Surgical History:  Procedure Laterality Date   BREAST SURGERY     right benign breast lump removed    COLONOSCOPY     COLOSTOMY  02/07/2012   Procedure: COLOSTOMY;  Surgeon: Camellia CHRISTELLA Blush, MD,FACS;  Location: [facility];  Service: General;  Laterality: Left;    COLOSTOMY TAKEDOWN  09/01/2012   Procedure: LAPAROSCOPIC COLOSTOMY TAKEDOWN;  Surgeon: Camellia CHRISTELLA Blush, MD,FACS;  Location: [facility];  Service: General;  Laterality: N/A;  Lap Assisted Colostomy Reversal    FLEXIBLE SIGMOIDOSCOPY  09/01/2012   Procedure: FLEXIBLE SIGMOIDOSCOPY;  Surgeon: Camellia CHRISTELLA Blush, MD,FACS;  Location: [facility];  Service: General;;   HYSTEROSCOPY WITH D & C N/A 07/17/2014   Procedure: DILATATION AND CURETTAGE LELDON;  Surgeon: Dickie DELENA Carder, MD;  Location: [facility];  Service: Gynecology;  Laterality: N/A;   LAPAROSCOPIC SIGMOID COLECTOMY  09/01/2012   Procedure: LAPAROSCOPIC SIGMOID COLECTOMY;  Surgeon: Camellia CHRISTELLA Blush, MD,FACS;  Location: [facility];  Service: General;;   LAPAROTOMY  02/07/2012   Procedure: EXPLORATORY LAPAROTOMY;  Surgeon: Camellia CHRISTELLA Blush, MD,FACS;  Location: [facility];  Service: General;  Laterality: Bilateral;   NOSE SURGERY     PARTIAL COLECTOMY  02/07/2012   Procedure: PARTIAL COLECTOMY;  Surgeon: Camellia CHRISTELLA Blush, MD,FACS;  Location: [facility];  Service: General;  Laterality: N/A;   POLYPECTOMY     ROTATOR CUFF REPAIR     SIGMOIDOSCOPY     TUBAL LIGATION     Family History  Problem Relation Age of Onset   Diverticulosis Mother    Crohn's disease Mother    Diabetes Father    Hypertension Father    Colon cancer Neg Hx    Esophageal cancer Neg Hx    Stomach cancer Neg Hx    Rectal cancer Neg Hx    Colon polyps Neg Hx    Social History   Socioeconomic History   Marital status: Married    Spouse name: Not on file   Number of children: Not on file   Years of education: Not on file   Highest education level: Not on file  Occupational History   Not on file  Tobacco Use   Smoking status: Never   Smokeless tobacco: Never  Vaping Use   Vaping status: Never Used  Substance and Sexual Activity   Alcohol use: No   Drug use: No   Sexual activity: Not Currently  Other Topics Concern   Not on file  Social History Narrative   Not on file   Social Drivers of  Health   Financial Resource Strain: Low Risk  (08/09/2024)   Overall Financial Resource Strain (CARDIA)    Difficulty of Paying Living Expenses: Not hard at all  Food Insecurity: No Food Insecurity (08/09/2024)   Hunger Vital Sign    Worried About Running Out of Food in the Last Year: Never true    Ran Out of Food in the Last Year: Never true  Transportation Needs: No Transportation Needs (08/09/2024)   PRAPARE - Transportation    Lack of Transportation (  Medical): No    Lack of Transportation (Non-Medical): No  Physical Activity: Insufficiently Active (08/09/2024)   Exercise Vital Sign    Days of Exercise per Week: 3 days    Minutes of Exercise per Session: 30 min  Stress: No Stress Concern Present (08/09/2024)   [employer] of Occupational Health - Occupational Stress Questionnaire    Feeling of Stress: Not at all  Social Connections: Socially Integrated (08/09/2024)   Social Connection and Isolation Panel    Frequency of Communication with Friends and Family: More than three times a week    Frequency of Social Gatherings with Friends and Family: Three times a week    Attends Religious Services: More than 4 times per year    Active Member of Clubs or Organizations: Yes    Attends Engineer, structural: More than 4 times per year    Marital Status: Married    Tobacco Counseling Counseling given: Not Answered   Clinical Intake:  Pre-visit preparation completed: Yes  Pain : No/denies pain     Diabetes: No  How often do you need to have someone help you when you read instructions, pamphlets, or other written materials from your doctor or pharmacy?: 1 - Never  Interpreter Needed?: No  Information entered by :: Mliss Graff LPN   Activities of Daily Living    08/09/2024    3:57 PM 08/03/2024    1:12 PM  In your present state of health, do you have any difficulty performing the following activities:  Hearing? 0 0  Vision? 0 0  Difficulty concentrating or  making decisions? 0 0  Walking or climbing stairs? 0 0  Dressing or bathing? 0 0  Doing errands, shopping? 0 0  Preparing Food and eating ? N N  Using the Toilet? N N  In the past six months, have you accidently leaked urine? Y Y  Do you have problems with loss of bowel control? N N  Managing your Medications? N N  Managing your Finances? N N  Housekeeping or managing your Housekeeping? N N    Patient Care Team: Kayla Jeoffrey RAMAN, FNP as PCP - General (Family Medicine) Obgyn, Anna  Indicate any recent Medical Services you may have received from other than [facility] providers in the past year (date may be approximate).     Assessment:   This is a routine wellness examination for [place].  Hearing/Vision screen Hearing Screening - Comments:: No toruble hearing Vision Screening - Comments:: Up to date Looking MD   Goals Addressed             This Visit's Progress    Patient Stated       Remain independent       Depression Screen    08/09/2024    3:33 PM 08/04/2023    3:38 PM 08/22/2019    3:12 PM 02/15/2017    9:31 AM  PHQ 2/9 Scores  PHQ - 2 Score 0 0 0 0  PHQ- 9 Score 0   3    Fall Risk    08/09/2024    3:29 PM 08/03/2024    1:12 PM 08/04/2023    3:39 PM 08/22/2019    3:12 PM 02/15/2017    9:31 AM  Fall Risk   Falls in the past year? 0 0 0 0  No   Number falls in past yr: 0 0 0    Injury with Fall? 0 0 0    Risk for fall due to :  No Fall Risks    Follow up Falls evaluation completed;Education provided;Falls prevention discussed  Falls prevention discussed;Education provided;Falls evaluation completed Falls evaluation completed       Data saved with a previous flowsheet row definition    MEDICARE RISK AT HOME: Medicare Risk at Home Any stairs in or around the home?: Yes If so, are there any without handrails?: No Home free of loose throw rugs in walkways, pet beds, electrical cords, etc?: No Adequate lighting in your home to reduce risk of falls?:  Yes Life alert?: No Use of a cane, walker or w/c?: No Grab bars in the bathroom?: Yes Shower chair or bench in shower?: No Elevated toilet seat or a handicapped toilet?: Yes  TIMED UP AND GO:  Was the test performed?  No    Cognitive Function:        08/09/2024    3:34 PM 08/04/2023    3:39 PM  6CIT Screen  What Year? 0 points 0 points  What month? 0 points 0 points  What time? 0 points 0 points  Count back from 20 0 points 0 points  Months in reverse 0 points 0 points  Repeat phrase 0 points 0 points  Total Score 0 points 0 points    Immunizations Immunization History  Administered Date(s) Administered   Influenza Split 09/05/2012   Influenza,inj,Quad PF,6+ Mos 07/23/2014, 07/17/2015   Pneumococcal Polysaccharide-23 09/05/2012    TDAP status: Due, Education has been provided regarding the importance of this vaccine. Advised may receive this vaccine at local pharmacy or Health Dept. Aware to provide a copy of the vaccination record if obtained from local pharmacy or Health Dept. Verbalized acceptance and understanding.  Flu Vaccine status: Due, Education has been provided regarding the importance of this vaccine. Advised may receive this vaccine at local pharmacy or Health Dept. Aware to provide a copy of the vaccination record if obtained from local pharmacy or Health Dept. Verbalized acceptance and understanding.  Pneumococcal vaccine status: Due, Education has been provided regarding the importance of this vaccine. Advised may receive this vaccine at local pharmacy or Health Dept. Aware to provide a copy of the vaccination record if obtained from local pharmacy or Health Dept. Verbalized acceptance and understanding.  Covid-19 vaccine status: Declined, Education has been provided regarding the importance of this vaccine but patient still declined. Advised may receive this vaccine at local pharmacy or Health Dept.or vaccine clinic. Aware to provide a copy of the vaccination  record if obtained from local pharmacy or Health Dept. Verbalized acceptance and understanding.  Qualifies for Shingles Vaccine? Yes   Zostavax completed No   Shingrix Completed?: No.    Education has been provided regarding the importance of this vaccine. Patient has been advised to call insurance company to determine out of pocket expense if they have not yet received this vaccine. Advised may also receive vaccine at local pharmacy or Health Dept. Verbalized acceptance and understanding.  Screening Tests Health Maintenance  Topic Date Due   DTaP/Tdap/Td (1 - Tdap) Never done   Zoster Vaccines- Shingrix (1 of 2) Never done   Pneumococcal Vaccine: 50+ Years (2 of 2 - PCV) 09/05/2013   COVID-19 Vaccine (1 - 2025-26 season) 08/25/2024 (Originally 07/02/2024)   Influenza Vaccine  01/29/2025 (Originally 06/01/2024)   Mammogram  06/01/2025   Medicare Annual Wellness (AWV)  08/09/2025   Colonoscopy  07/04/2029   DEXA SCAN  Completed   Hepatitis C Screening  Completed   Meningococcal B Vaccine  Aged Out  Health Maintenance  Health Maintenance Due  Topic Date Due   DTaP/Tdap/Td (1 - Tdap) Never done   Zoster Vaccines- Shingrix (1 of 2) Never done   Pneumococcal Vaccine: 50+ Years (2 of 2 - PCV) 09/05/2013    Colorectal cancer screening: Type of screening: Colonoscopy. Completed 2025. Repeat every 5 years  Mammogram status: Completed  . Repeat every year  Bone Density status: Completed 2021. Results reflect: Bone density results: OSTEOPENIA. Repeat every 2 years.  Lung Cancer Screening: (Low Dose CT Chest recommended if Age 83-80 years, 20 pack-year currently smoking OR have quit w/in 15years.) does not qualify.   Lung Cancer Screening Referral:   Additional Screening:  Hepatitis C Screening: does not qualify; Completed 2024  Vision Screening: Recommended annual ophthalmology exams for early detection of glaucoma and other disorders of the eye. Is the patient up to date with their  annual eye exam?  Yes  Who is the provider or what is the name of the office in which the patient attends annual eye exams?   Education Provided If pt is not established with a provider, would they like to be referred to a provider to establish care? No .   Dental Screening: Recommended annual dental exams for proper oral hygiene    Community Resource Referral / Chronic Care Management: CRR required this visit?  No   CCM required this visit?  No     Plan:     I have personally reviewed and noted the following in the patient's chart:   Medical and social history Use of alcohol, tobacco or illicit drugs  Current medications and supplements including opioid prescriptions. Patient is not currently taking opioid prescriptions. Functional ability and status Nutritional status Physical activity Advanced directives List of other physicians Hospitalizations, surgeries, and ER visits in previous 12 months Vitals Screenings to include cognitive, depression, and falls Referrals and appointments  In addition, I have reviewed and discussed with patient certain preventive protocols, quality metrics, and best practice recommendations. A written personalized care plan for preventive services as well as general preventive health recommendations were provided to patient.     Mliss Graff, LPN   [id number]   After Visit Summary: (MyChart) Due to this being a telephonic visit, the after visit summary with patients personalized plan was offered to patient via MyChart   Nurse Notes:

## 2024-08-09 NOTE — Patient Instructions (Signed)
 [name] , Thank you for taking time to come for your Medicare Wellness Visit. I appreciate your ongoing commitment to your health goals. Please review the following plan we discussed and let me know if I can assist you in the future.   Screening recommendations/referrals: Colonoscopy: up to date Mammogram: up to date Bone Density: up to date Recommended yearly ophthalmology/optometry visit for glaucoma screening and checkup Recommended yearly dental visit for hygiene and checkup  Vaccinations: Influenza vaccine:  Pneumococcal vaccine:  Tdap vaccine:  Shingles vaccine:        Preventive Care 65 Years and Older, Female Preventive care refers to lifestyle choices and visits with your health care provider that can promote health and wellness. What does preventive care include? A yearly physical exam. This is also called an annual well check. Dental exams once or twice a year. Routine eye exams. Ask your health care provider how often you should have your eyes checked. Personal lifestyle choices, including: Daily care of your teeth and gums. Regular physical activity. Eating a healthy diet. Avoiding tobacco and drug use. Limiting alcohol use. Practicing safe sex. Taking low-dose aspirin every day. Taking vitamin and mineral supplements as recommended by your health care provider. What happens during an annual well check? The services and screenings done by your health care provider during your annual well check will depend on your age, overall health, lifestyle risk factors, and family history of disease. Counseling  Your health care provider may ask you questions about your: Alcohol use. Tobacco use. Drug use. Emotional well-being. Home and relationship well-being. Sexual activity. Eating habits. History of falls. Memory and ability to understand (cognition). Work and work Astronomer. Reproductive health. Screening  You may have the following tests or  measurements: Height, weight, and BMI. Blood pressure. Lipid and cholesterol levels. These may be checked every 5 years, or more frequently if you are over 52 years old. Skin check. Lung cancer screening. You may have this screening every year starting at age 54 if you have a 30-pack-year history of smoking and currently smoke or have quit within the past 15 years. Fecal occult blood test (FOBT) of the stool. You may have this test every year starting at age 55. Flexible sigmoidoscopy or colonoscopy. You may have a sigmoidoscopy every 5 years or a colonoscopy every 10 years starting at age 40. Hepatitis C blood test. Hepatitis B blood test. Sexually transmitted disease (STD) testing. Diabetes screening. This is done by checking your blood sugar (glucose) after you have not eaten for a while (fasting). You may have this done every 1-3 years. Bone density scan. This is done to screen for osteoporosis. You may have this done starting at age 67. Mammogram. This may be done every 1-2 years. Talk to your health care provider about how often you should have regular mammograms. Talk with your health care provider about your test results, treatment options, and if necessary, the need for more tests. Vaccines  Your health care provider may recommend certain vaccines, such as: Influenza vaccine. This is recommended every year. Tetanus, diphtheria, and acellular pertussis (Tdap, Td) vaccine. You may need a Td booster every 10 years. Zoster vaccine. You may need this after age 35. Pneumococcal 13-valent conjugate (PCV13) vaccine. One dose is recommended after age 95. Pneumococcal polysaccharide (PPSV23) vaccine. One dose is recommended after age 80. Talk to your health care provider about which screenings and vaccines you need and how often you need them. This information is not intended to replace advice  given to you by your health care provider. Make sure you discuss any questions you have with your  health care provider. Document Released: 11/14/2015 Document Revised: 07/07/2016 Document Reviewed: 08/19/2015 Elsevier Interactive Patient Education  2017 [employer].  Fall Prevention in the Home Falls can cause injuries. They can happen to people of all ages. There are many things you can do to make your home safe and to help prevent falls. What can I do on the outside of my home? Regularly fix the edges of walkways and driveways and fix any cracks. Remove anything that might make you trip as you walk through a door, such as a raised step or threshold. Trim any bushes or trees on the path to your home. Use bright outdoor lighting. Clear any walking paths of anything that might make someone trip, such as rocks or tools. Regularly check to see if handrails are loose or broken. Make sure that both sides of any steps have handrails. Any raised decks and porches should have guardrails on the edges. Have any leaves, snow, or ice cleared regularly. Use sand or salt on walking paths during winter. Clean up any spills in your garage right away. This includes oil or grease spills. What can I do in the bathroom? Use night lights. Install grab bars by the toilet and in the tub and shower. Do not use towel bars as grab bars. Use non-skid mats or decals in the tub or shower. If you need to sit down in the shower, use a plastic, non-slip stool. Keep the floor dry. Clean up any water that spills on the floor as soon as it happens. Remove soap buildup in the tub or shower regularly. Attach bath mats securely with double-sided non-slip rug tape. Do not have throw rugs and other things on the floor that can make you trip. What can I do in the bedroom? Use night lights. Make sure that you have a light by your bed that is easy to reach. Do not use any sheets or blankets that are too big for your bed. They should not hang down onto the floor. Have a firm chair that has side arms. You can use this for  support while you get dressed. Do not have throw rugs and other things on the floor that can make you trip. What can I do in the kitchen? Clean up any spills right away. Avoid walking on wet floors. Keep items that you use a lot in easy-to-reach places. If you need to reach something above you, use a strong step stool that has a grab bar. Keep electrical cords out of the way. Do not use floor polish or wax that makes floors slippery. If you must use wax, use non-skid floor wax. Do not have throw rugs and other things on the floor that can make you trip. What can I do with my stairs? Do not leave any items on the stairs. Make sure that there are handrails on both sides of the stairs and use them. Fix handrails that are broken or loose. Make sure that handrails are as long as the stairways. Check any carpeting to make sure that it is firmly attached to the stairs. Fix any carpet that is loose or worn. Avoid having throw rugs at the top or bottom of the stairs. If you do have throw rugs, attach them to the floor with carpet tape. Make sure that you have a light switch at the top of the stairs and the bottom of  the stairs. If you do not have them, ask someone to add them for you. What else can I do to help prevent falls? Wear shoes that: Do not have high heels. Have rubber bottoms. Are comfortable and fit you well. Are closed at the toe. Do not wear sandals. If you use a stepladder: Make sure that it is fully opened. Do not climb a closed stepladder. Make sure that both sides of the stepladder are locked into place. Ask someone to hold it for you, if possible. Clearly mark and make sure that you can see: Any grab bars or handrails. First and last steps. Where the edge of each step is. Use tools that help you move around (mobility aids) if they are needed. These include: Canes. Walkers. Scooters. Crutches. Turn on the lights when you go into a dark area. Replace any light bulbs as soon  as they burn out. Set up your furniture so you have a clear path. Avoid moving your furniture around. If any of your floors are uneven, fix them. If there are any pets around you, be aware of where they are. Review your medicines with your doctor. Some medicines can make you feel dizzy. This can increase your chance of falling. Ask your doctor what other things that you can do to help prevent falls. This information is not intended to replace advice given to you by your health care provider. Make sure you discuss any questions you have with your health care provider. Document Released: 08/14/2009 Document Revised: 03/25/2016 Document Reviewed: 11/22/2014 Elsevier Interactive Patient Education  2017 [employer].

## 2024-10-08 DIAGNOSIS — Z124 Encounter for screening for malignant neoplasm of cervix: Secondary | ICD-10-CM | POA: Diagnosis not present

## 2024-10-08 DIAGNOSIS — Z1331 Encounter for screening for depression: Secondary | ICD-10-CM | POA: Diagnosis not present

## 2024-10-08 DIAGNOSIS — Z1231 Encounter for screening mammogram for malignant neoplasm of breast: Secondary | ICD-10-CM | POA: Diagnosis not present

## 2024-10-08 LAB — HM MAMMOGRAPHY

## 2025-08-15 ENCOUNTER — Ambulatory Visit
